# Patient Record
Sex: Male | Born: 1955 | Race: White | Hispanic: No | Marital: Married | Smoking: Never smoker
Health system: Southern US, Community
[De-identification: ages and names within clinical notes are randomized; demographics above are authoritative.]

## PROBLEM LIST (undated history)

## (undated) DIAGNOSIS — D352 Benign neoplasm of pituitary gland: Secondary | ICD-10-CM

## (undated) DIAGNOSIS — K219 Gastro-esophageal reflux disease without esophagitis: Secondary | ICD-10-CM

## (undated) DIAGNOSIS — M5412 Radiculopathy, cervical region: Secondary | ICD-10-CM

## (undated) DIAGNOSIS — M5030 Other cervical disc degeneration, unspecified cervical region: Secondary | ICD-10-CM

## (undated) DIAGNOSIS — R042 Hemoptysis: Secondary | ICD-10-CM

## (undated) DIAGNOSIS — IMO0001 Reserved for inherently not codable concepts without codable children: Secondary | ICD-10-CM

## (undated) DIAGNOSIS — R931 Abnormal findings on diagnostic imaging of heart and coronary circulation: Secondary | ICD-10-CM

## (undated) DIAGNOSIS — I10 Essential (primary) hypertension: Secondary | ICD-10-CM

## (undated) HISTORY — DX: Other cervical disc degeneration, unspecified cervical region: M50.30

## (undated) HISTORY — DX: Essential (primary) hypertension: I10

## (undated) HISTORY — DX: Gastro-esophageal reflux disease without esophagitis: K21.9

## (undated) HISTORY — DX: Abnormal findings on diagnostic imaging of heart and coronary circulation: R93.1

## (undated) HISTORY — DX: Hemoptysis: R04.2

## (undated) HISTORY — DX: Reserved for inherently not codable concepts without codable children: IMO0001

## (undated) HISTORY — DX: Radiculopathy, cervical region: M54.12

## (undated) HISTORY — PX: RIGHT HEART CATH: SHX6075

## (undated) HISTORY — PX: CATARACT EXTRACTION: SUR2

## (undated) HISTORY — DX: Benign neoplasm of pituitary gland: D35.2

---

## 2003-06-13 ENCOUNTER — Ambulatory Visit (HOSPITAL_COMMUNITY): Admission: RE | Admit: 2003-06-13 | Discharge: 2003-06-13 | Payer: Self-pay | Admitting: *Deleted

## 2003-06-13 IMAGING — CR DG CHEST 2V
2 series · 2 of 2 positions shown · non-contrast
Comparison: None.

CLINICAL DATA: Chest pain; pre-cardiac catheterization evaluation. 
 TWO VIEW CHEST ? [DATE]

[view not recorded (1 of 2)]
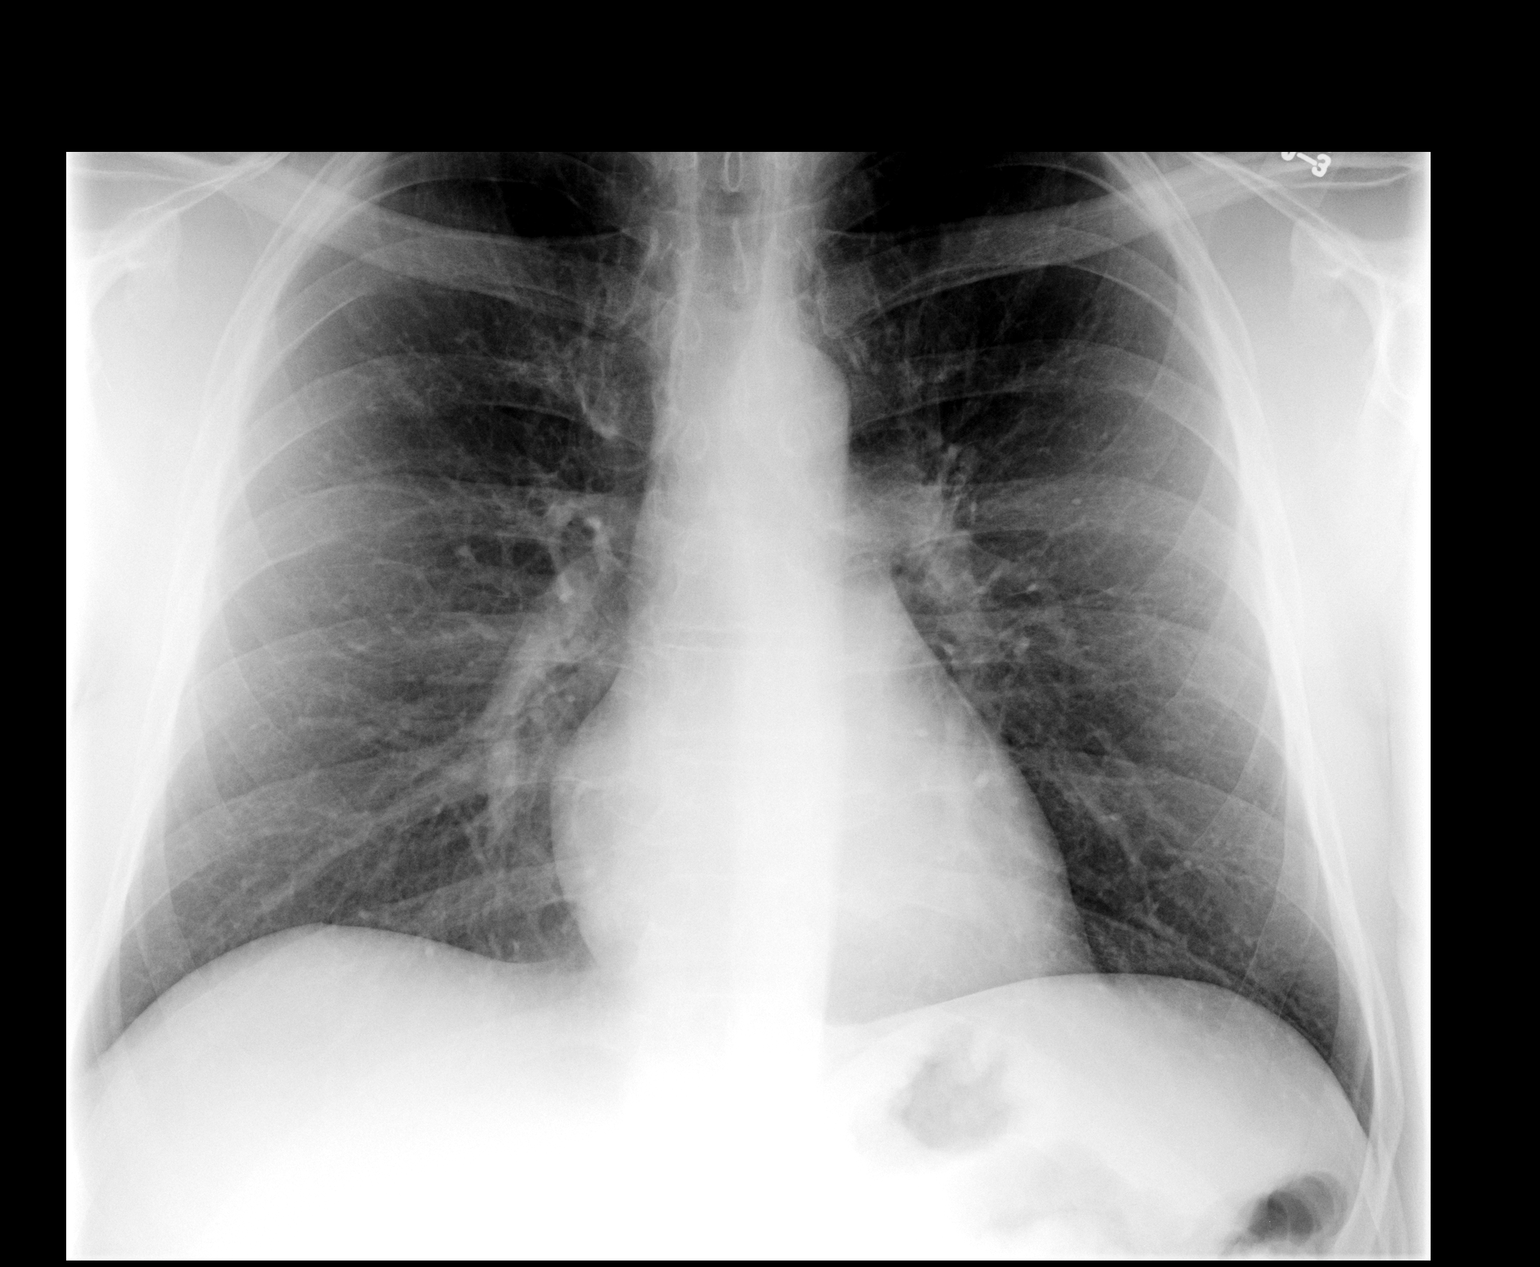

[view not recorded (2 of 2)]
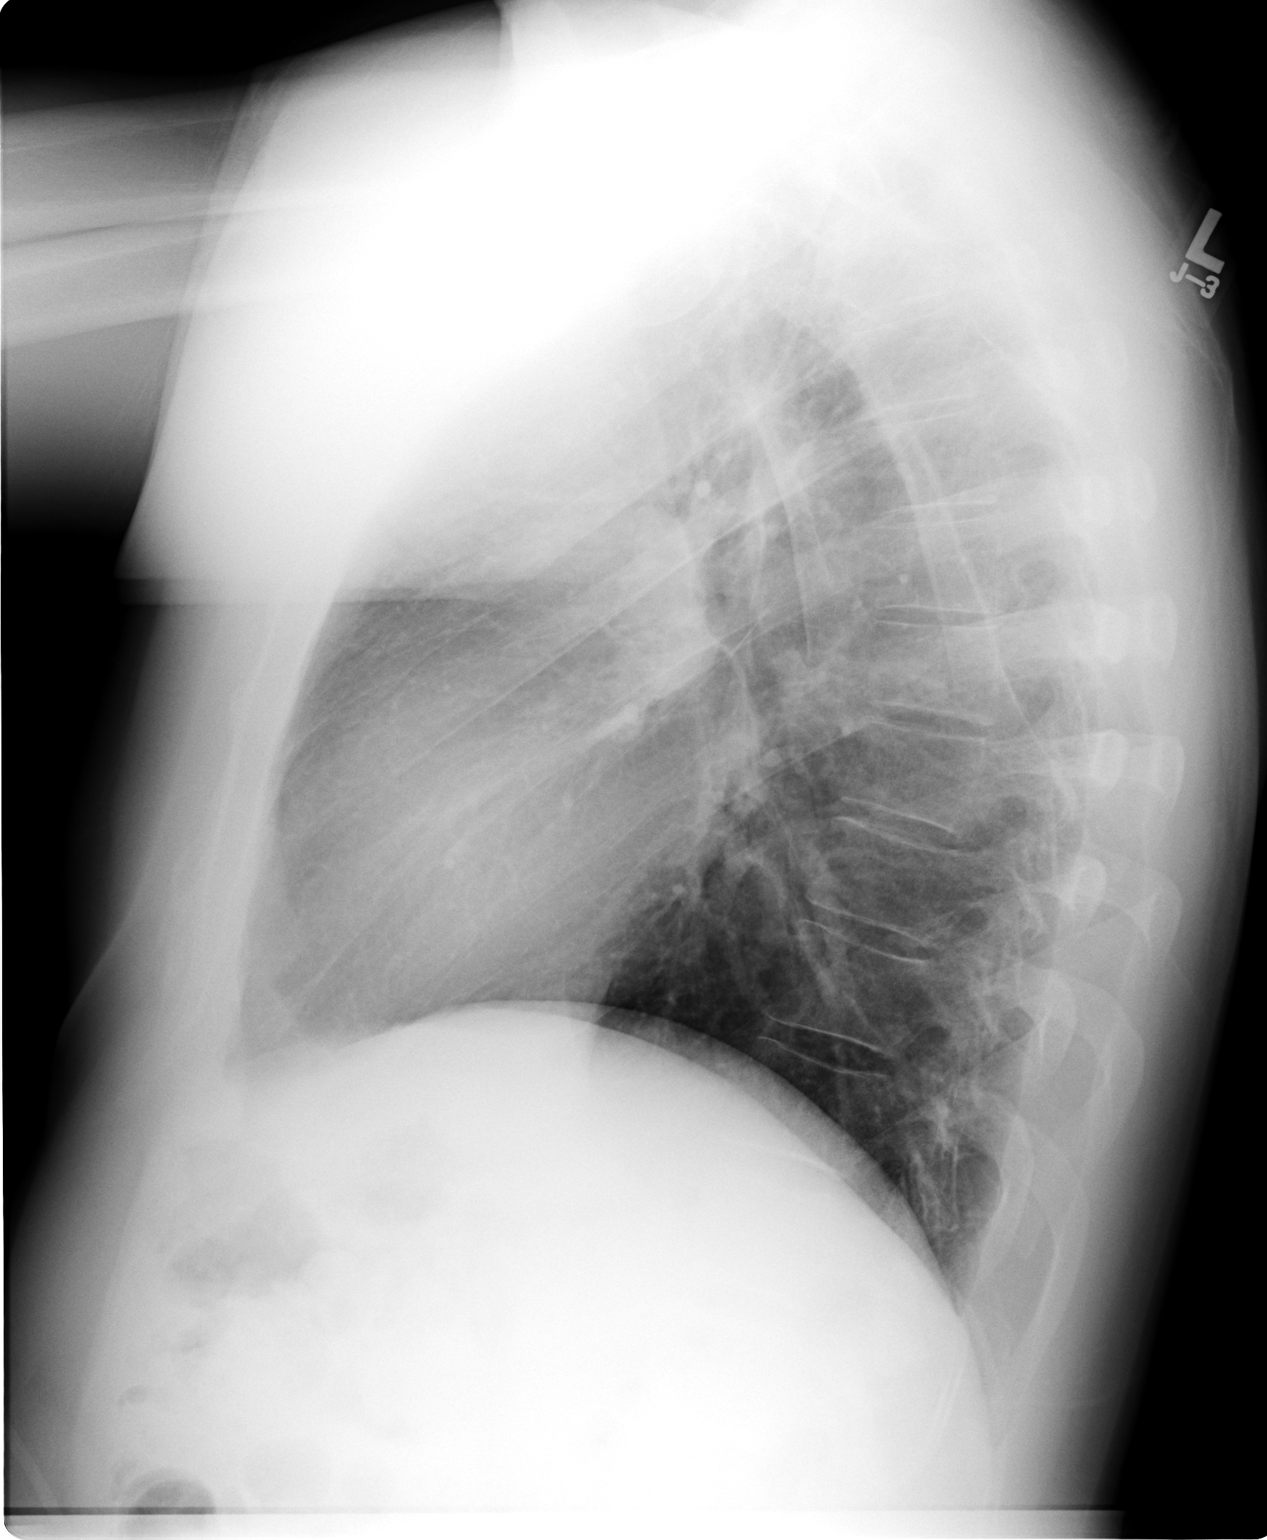

[2 of 2 positions shown; findings below may reference images not displayed]

The cardiomediastinal silhouette is unremarkable.  The lungs appear clear. The visualized bony thorax appears intact.  
 IMPRESSION 
 Normal chest.

## 2004-01-07 HISTORY — PX: RIGHT HEART CATH: SHX6075

## 2011-03-27 ENCOUNTER — Encounter: Payer: Self-pay | Admitting: Cardiovascular Disease

## 2011-04-18 ENCOUNTER — Other Ambulatory Visit (HOSPITAL_COMMUNITY): Payer: Self-pay | Admitting: Family Medicine

## 2011-04-18 ENCOUNTER — Ambulatory Visit (HOSPITAL_COMMUNITY): Payer: PRIVATE HEALTH INSURANCE | Attending: Internal Medicine

## 2011-04-18 ENCOUNTER — Other Ambulatory Visit: Payer: Self-pay

## 2011-04-18 DIAGNOSIS — E785 Hyperlipidemia, unspecified: Secondary | ICD-10-CM | POA: Insufficient documentation

## 2011-04-18 DIAGNOSIS — R079 Chest pain, unspecified: Secondary | ICD-10-CM

## 2011-04-18 DIAGNOSIS — I10 Essential (primary) hypertension: Secondary | ICD-10-CM | POA: Insufficient documentation

## 2011-04-18 DIAGNOSIS — R9431 Abnormal electrocardiogram [ECG] [EKG]: Secondary | ICD-10-CM | POA: Insufficient documentation

## 2011-04-18 DIAGNOSIS — I080 Rheumatic disorders of both mitral and aortic valves: Secondary | ICD-10-CM | POA: Insufficient documentation

## 2011-04-21 ENCOUNTER — Other Ambulatory Visit (HOSPITAL_COMMUNITY): Payer: Self-pay | Admitting: Radiology

## 2011-04-21 DIAGNOSIS — R9431 Abnormal electrocardiogram [ECG] [EKG]: Secondary | ICD-10-CM

## 2011-04-22 ENCOUNTER — Ambulatory Visit (HOSPITAL_COMMUNITY): Payer: PRIVATE HEALTH INSURANCE | Attending: Cardiology

## 2011-04-22 ENCOUNTER — Encounter (HOSPITAL_COMMUNITY): Payer: Self-pay | Admitting: *Deleted

## 2011-04-22 ENCOUNTER — Encounter: Payer: Self-pay | Admitting: Cardiovascular Disease

## 2011-04-22 DIAGNOSIS — R5383 Other fatigue: Secondary | ICD-10-CM | POA: Insufficient documentation

## 2011-04-22 DIAGNOSIS — R9431 Abnormal electrocardiogram [ECG] [EKG]: Secondary | ICD-10-CM

## 2011-04-22 DIAGNOSIS — R072 Precordial pain: Secondary | ICD-10-CM | POA: Insufficient documentation

## 2011-04-22 DIAGNOSIS — R55 Syncope and collapse: Secondary | ICD-10-CM | POA: Insufficient documentation

## 2011-04-22 DIAGNOSIS — E785 Hyperlipidemia, unspecified: Secondary | ICD-10-CM | POA: Insufficient documentation

## 2011-04-22 DIAGNOSIS — R000 Tachycardia, unspecified: Secondary | ICD-10-CM | POA: Insufficient documentation

## 2011-04-22 DIAGNOSIS — R079 Chest pain, unspecified: Secondary | ICD-10-CM

## 2011-04-22 DIAGNOSIS — R42 Dizziness and giddiness: Secondary | ICD-10-CM | POA: Insufficient documentation

## 2011-04-22 DIAGNOSIS — R0989 Other specified symptoms and signs involving the circulatory and respiratory systems: Secondary | ICD-10-CM

## 2011-04-22 DIAGNOSIS — R002 Palpitations: Secondary | ICD-10-CM | POA: Insufficient documentation

## 2011-04-22 DIAGNOSIS — I10 Essential (primary) hypertension: Secondary | ICD-10-CM | POA: Insufficient documentation

## 2011-04-22 DIAGNOSIS — R5381 Other malaise: Secondary | ICD-10-CM | POA: Insufficient documentation

## 2011-04-22 DIAGNOSIS — Z8249 Family history of ischemic heart disease and other diseases of the circulatory system: Secondary | ICD-10-CM | POA: Insufficient documentation

## 2011-04-23 ENCOUNTER — Encounter (HOSPITAL_COMMUNITY): Payer: Self-pay | Admitting: Family Medicine

## 2015-05-31 ENCOUNTER — Encounter: Payer: Self-pay | Admitting: Allergy and Immunology

## 2015-05-31 ENCOUNTER — Ambulatory Visit (INDEPENDENT_AMBULATORY_CARE_PROVIDER_SITE_OTHER): Payer: PRIVATE HEALTH INSURANCE | Admitting: Allergy and Immunology

## 2015-05-31 VITALS — BP 126/78 | HR 76 | Temp 98.1°F | Resp 20 | Ht 72.0 in | Wt 262.3 lb

## 2015-05-31 DIAGNOSIS — R059 Cough, unspecified: Secondary | ICD-10-CM

## 2015-05-31 DIAGNOSIS — J387 Other diseases of larynx: Secondary | ICD-10-CM | POA: Diagnosis not present

## 2015-05-31 DIAGNOSIS — K219 Gastro-esophageal reflux disease without esophagitis: Secondary | ICD-10-CM

## 2015-05-31 DIAGNOSIS — R05 Cough: Secondary | ICD-10-CM

## 2015-05-31 MED ORDER — RANITIDINE HCL 300 MG PO CAPS: 300.0000 mg | ORAL_CAPSULE | Freq: Every evening | ORAL | Status: DC

## 2015-05-31 NOTE — Progress Notes (Signed)
Dear Dr. Nicki Reaper,  Thank you for referring Adam [name] to the [facility] on 05/31/2015.   Below is a summation of this patient's evaluation and recommendations.  Thank you for your referral. I will keep you informed about this patient's response to treatment.   If you have any questions please to do hestitate to contact me.   Sincerely,  Jiles Prows, MD [place]   ______________________________________________________________________    NEW PATIENT NOTE  Referring Provider: Myer Peer, MD Primary Provider: Ann Held, MD Date of office visit: 05/31/2015    Subjective:   Chief Complaint:  Adam [name] (DOB: May 22, 1955) is a 60 y.o. male with a chief complaint of Cough  who presents to the clinic on 05/31/2015 with the following problems:  HPI: Adam [name] presents to this clinic in evaluation of cough. He's been coughing for years but it is been somewhat worse the past year. He has spells of cough associated with gagging and retching. He states that he is developed "whooping cough" several times. He has this tickle in his throat and he gets intermittent raspy voice. He does not have any associated chest tightness or shortness of breath or exercise induced bronchospastic symptoms or cold air induced bronchospastic symptoms. He's been given a Symbicort inhaler which he has tried this inhaler preventatively before going to church which apparently does trigger some of this coughing but this was ineffective in preventing this cough. Sometimes strong smells precipitate his cough as well. He does not really have a tremendous amount of atopic disease. It does not sound as though he's had a chest x-ray in the past decade.  Adam [name] does have reflux. He's had very significant reflux in the past associated with chest pain for which he has had a negative cardiac workup. He's been treated with  zegerid and omeprazole in the past and 3 weeks ago was given dexilant which has helped his heartburn and regurgitation and chest discomfort quite a lot since starting this agent. He drinks a coffee and 3 sodas and tea throughout the day and intermittently eats chocolate.  Past Medical History  Diagnosis Date  . Hypertension   . Reflux     Past Surgical History  Procedure Laterality Date  . Right heart cath    . Cataract extraction        Medication List           aspirin EC 81 MG tablet  Take 81 mg by mouth daily.     budesonide-formoterol 160-4.5 MCG/ACT inhaler  Commonly known as:  SYMBICORT  Inhale 2 puffs into the lungs 2 (two) times daily.     cetirizine 10 MG tablet  Commonly known as:  ZYRTEC  Take 10 mg by mouth daily.     chlorthalidone 25 MG tablet  Commonly known as:  HYGROTON  Take 25 mg by mouth daily.     DEXILANT 60 MG capsule  Generic drug:  dexlansoprazole  Take 60 mg by mouth daily.     FLAXSEED OIL MAX STR 1300 MG Caps  Take 1 tablet by mouth daily.     levothyroxine 75 MCG tablet  Commonly known as:  SYNTHROID, LEVOTHROID  Take 75 mcg by mouth daily before breakfast.     metoprolol succinate 25 MG 24 hr tablet  Commonly known as:  TOPROL-XL  Take 25 mg by mouth daily.     rosuvastatin 10  MG tablet  Commonly known as:  CRESTOR  Take 10 mg by mouth daily.     telmisartan 80 MG tablet  Commonly known as:  MICARDIS  Take 80 mg by mouth daily.     testosterone cypionate 200 MG/ML injection  Commonly known as:  DEPOTESTOSTERONE CYPIONATE  Inject 1 mL into the muscle every 14 (fourteen) days.        No Known Allergies  Review of systems negative except as noted in HPI / PMHx or noted below:  Review of Systems  Constitutional: Negative.   HENT: Negative.   Eyes: Negative.   Respiratory: Negative.   Cardiovascular: Negative.   Gastrointestinal: Negative.   Genitourinary: Negative.   Musculoskeletal: Negative.   Skin: Negative.     Neurological: Negative.   Endo/Heme/Allergies: Negative.   Psychiatric/Behavioral: Negative.     Family History  Problem Relation Age of Onset  . Hypertension Mother   . Ovarian cancer Mother   . Hypertension Father   . Pancreatic cancer Father   . Hypertension Sister   . Hypertension Brother   . Ovarian cancer Maternal Grandmother   . Heart disease Maternal Grandfather     Social History   Social History  . Marital Status: Married    Spouse Name: N/A  . Number of Children: N/A  . Years of Education: N/A   Occupational History  . Not on file.   Social History Main Topics  . Smoking status: Never Smoker   . Smokeless tobacco: Never Used  . Alcohol Use: Not on file  . Drug Use: Not on file  . Sexual Activity: Not on file   Other Topics Concern  . Not on file   Social History Narrative  . No narrative on file    Environmental and Social history  Lives in a house with a dry environment, no animals located inside the household, carpeting in the bedroom, no plastic on the better pillow, and no smokers located inside the household. He works as a Counsellor for [employer]   Objective:   Filed Vitals:   05/31/15 0901  BP: 126/78  Pulse: 76  Temp: 98.1 F (36.7 C)  Resp: 20   Height: 6' (182.9 cm) Weight: 262 lb 5.6 oz (119 kg)  Physical Exam  Constitutional: He is well-developed, well-nourished, and in no distress.  Slightly raspy voice  HENT:  Head: Normocephalic. Head is without right periorbital erythema and without left periorbital erythema.  Right Ear: Tympanic membrane, external ear and ear canal normal.  Left Ear: Tympanic membrane, external ear and ear canal normal.  Nose: Nose normal. No mucosal edema or rhinorrhea.  Mouth/Throat: Uvula is midline, oropharynx is clear and moist and mucous membranes are normal. No oropharyngeal exudate.  Eyes: Conjunctivae and lids are normal. Pupils are equal, round, and reactive to light.  Neck: Trachea normal. No  tracheal tenderness present. No tracheal deviation present. No thyromegaly present.  Cardiovascular: Normal rate, regular rhythm, S1 normal, S2 normal and normal heart sounds.   No murmur heard. Pulmonary/Chest: Effort normal and breath sounds normal. No stridor. No tachypnea. No respiratory distress. He has no wheezes. He has no rales. He exhibits no tenderness.  Abdominal: Soft. He exhibits no distension and no mass. There is no hepatosplenomegaly. There is no tenderness. There is no rebound and no guarding.  Musculoskeletal: He exhibits no edema or tenderness.  Lymphadenopathy:       Head (right side): No tonsillar adenopathy present.       Head (left  side): No tonsillar adenopathy present.    He has no cervical adenopathy.    He has no axillary adenopathy.  Neurological: He is alert. Gait normal.  Skin: No rash noted. He is not diaphoretic. No erythema. No pallor. Nails show no clubbing.  Psychiatric: Mood and affect normal.     Diagnostics: Allergy skin tests were not performed.   Spirometry was performed and demonstrated an FEV1 of 4.01 @ 103 % of predicted. Following the administration of nebulized albuterol his FEV1 rose to 4.17 which was a change of 4%.    Assessment and Plan:    1. LPRD (laryngopharyngeal reflux disease)   2. Cough     1. Consolidate caffeine and chocolate consumption  2. Continue Dexilant 60 mg one tablet in a.m.  3. Start ranitidine 300 mg one tablet in PM  4. Can continue Zyrtec 10 mg once a day if needed  5. Obtain a chest x-ray for cough  6. Return to clinic in 4 weeks or earlier if problem  Prior to moving forward with an investigation of multiple etiologic factors that may be contributing to Adam [name]'s cough I will empirically treat him with aggressive therapy directed against reflux with a combination of proton pump inhibitor and an H2 receptor blocker and have him consolidate his caffeine and chocolate consumption. His history is quite  consistent with LPR and hopefully within the next 4-12 weeks he will resolve the majority of his cough. I will obtain a chest x-ray as a screening test for other etiologic factors that may be contributing to his cough. I will hold off on any further evaluation for inflammatory respiratory disease at this point. I will see him back in this clinic in 4 weeks.   Jiles Prows, MD [facility]

## 2015-05-31 NOTE — Patient Instructions (Addendum)
  1. Consolidate caffeine and chocolate consumption  2. Continue Dexilant 60 mg one tablet in a.m.  3. Start ranitidine 300 mg one tablet in PM  4. Can continue Zyrtec 10 mg once a day if needed  5. Obtain a chest x-ray for cough  6. Return to clinic in 4 weeks or earlier if problem

## 2015-06-29 ENCOUNTER — Ambulatory Visit (INDEPENDENT_AMBULATORY_CARE_PROVIDER_SITE_OTHER): Payer: PRIVATE HEALTH INSURANCE | Admitting: Allergy and Immunology

## 2015-06-29 ENCOUNTER — Encounter: Payer: Self-pay | Admitting: Allergy and Immunology

## 2015-06-29 VITALS — BP 130/72 | HR 74 | Resp 16

## 2015-06-29 DIAGNOSIS — R059 Cough, unspecified: Secondary | ICD-10-CM

## 2015-06-29 DIAGNOSIS — J387 Other diseases of larynx: Secondary | ICD-10-CM

## 2015-06-29 DIAGNOSIS — R05 Cough: Secondary | ICD-10-CM | POA: Diagnosis not present

## 2015-06-29 DIAGNOSIS — K219 Gastro-esophageal reflux disease without esophagitis: Secondary | ICD-10-CM

## 2015-06-29 NOTE — Patient Instructions (Signed)
  1. Continue to Consolidate caffeine and chocolate consumption  2. Continue Dexilant 60 mg one tablet in a.m.  3. Continue ranitidine 300 mg one tablet in PM  4. Can continue Zyrtec 10 mg once a day if needed  5. Return to clinic in 8 weeks or earlier if problem

## 2015-06-29 NOTE — Progress Notes (Signed)
Follow-up Note  Referring Provider: Myer Peer, MD Primary Provider: Ann Held, MD Date of Office Visit: 06/29/2015  Subjective:   Adam [name] (DOB: October 12, 1955) is a 60 y.o. male who returns to the [facility] on 06/29/2015 in re-evaluation of the following:  HPI: Strummer returns to this clinic in evaluation of his cough. He has done wonderful since initiating medical therapy on 06/01/2015. He's only had one coughing spell that lasted less than a day after being exposed to very strong scents while at a funeral. Otherwise he is done very well and the tickle in his throat is basically resolved and he doesn't have any raspy voice. He's been very good about eliminating all this caffeine and chocolate consumption and he continues on his proton pump inhibitor and ranitidine    Medication List           aspirin EC 81 MG tablet  Take 81 mg by mouth daily.     budesonide-formoterol 160-4.5 MCG/ACT inhaler  Commonly known as:  SYMBICORT  Inhale 2 puffs into the lungs 2 (two) times daily.     cetirizine 10 MG tablet  Commonly known as:  ZYRTEC  Take 10 mg by mouth daily.     chlorthalidone 25 MG tablet  Commonly known as:  HYGROTON  Take 25 mg by mouth daily.     DEXILANT 60 MG capsule  Generic drug:  dexlansoprazole  Take 60 mg by mouth daily.     FLAXSEED OIL MAX STR 1300 MG Caps  Take 1 tablet by mouth daily.     levothyroxine 75 MCG tablet  Commonly known as:  SYNTHROID, LEVOTHROID  Take 75 mcg by mouth daily before breakfast.     metoprolol succinate 25 MG 24 hr tablet  Commonly known as:  TOPROL-XL  Take 25 mg by mouth daily.     ranitidine 300 MG capsule  Commonly known as:  ZANTAC  Take 1 capsule (300 mg total) by mouth every evening.     rosuvastatin 10 MG tablet  Commonly known as:  CRESTOR  Take 10 mg by mouth daily.     telmisartan 80 MG tablet  Commonly known as:  MICARDIS  Take 80 mg by mouth daily.     testosterone cypionate  200 MG/ML injection  Commonly known as:  DEPOTESTOSTERONE CYPIONATE  Inject 1 mL into the muscle every 14 (fourteen) days.        Past Medical History  Diagnosis Date  . Hypertension   . Reflux     Past Surgical History  Procedure Laterality Date  . Right heart cath    . Cataract extraction      No Known Allergies  Review of systems negative except as noted in HPI / PMHx or noted below:  Review of Systems  Constitutional: Negative.   HENT: Negative.   Eyes: Negative.   Respiratory: Negative.   Cardiovascular: Negative.   Gastrointestinal: Negative.   Genitourinary: Negative.   Musculoskeletal: Negative.   Skin: Negative.   Neurological: Negative.   Endo/Heme/Allergies: Negative.   Psychiatric/Behavioral: Negative.      Objective:   Filed Vitals:   06/29/15 1106  BP: 130/72  Pulse: 74  Resp: 16          Physical Exam  Constitutional: He is well-developed, well-nourished, and in no distress.  HENT:  Head: Normocephalic.  Right Ear: Tympanic membrane, external ear and ear canal normal.  Left Ear: Tympanic membrane, external ear and ear  canal normal.  Nose: Nose normal. No mucosal edema or rhinorrhea.  Mouth/Throat: Uvula is midline, oropharynx is clear and moist and mucous membranes are normal. No oropharyngeal exudate.  Eyes: Conjunctivae are normal.  Neck: Trachea normal. No tracheal tenderness present. No tracheal deviation present. No thyromegaly present.  Cardiovascular: Normal rate, regular rhythm, S1 normal, S2 normal and normal heart sounds.   No murmur heard. Pulmonary/Chest: Breath sounds normal. No stridor. No respiratory distress. He has no wheezes. He has no rales.  Musculoskeletal: He exhibits no edema.  Lymphadenopathy:       Head (right side): No tonsillar adenopathy present.       Head (left side): No tonsillar adenopathy present.    He has no cervical adenopathy.  Neurological: He is alert. Gait normal.  Skin: No rash noted. He is  not diaphoretic. No erythema. Nails show no clubbing.  Psychiatric: Mood and affect normal.    Diagnostics: Results of a chest x-ray obtained on 05/31/2015 identified no significant abnormality.   Assessment and Plan:   1. LPRD (laryngopharyngeal reflux disease)   2. Cough     1. Continue to Consolidate caffeine and chocolate consumption  2. Continue Dexilant 60 mg one tablet in a.m.  3. Continue ranitidine 300 mg one tablet in PM  4. Can continue Zyrtec 10 mg once a day if needed  5. Return to clinic in 8 weeks or earlier if problem  Jahmier has done quite well on his initial month of medical therapy and I would like for him to continue on this plan for at least a full 12 weeks and thus I will see him back in this clinic in 8 weeks. If he continues to do well there will probably be a opportunity to consolidate his medical therapy.  Allena Katz, MD [place]

## 2015-07-20 DIAGNOSIS — E291 Testicular hypofunction: Secondary | ICD-10-CM

## 2015-07-20 DIAGNOSIS — E039 Hypothyroidism, unspecified: Secondary | ICD-10-CM

## 2015-07-20 DIAGNOSIS — R7309 Other abnormal glucose: Secondary | ICD-10-CM | POA: Insufficient documentation

## 2015-07-20 DIAGNOSIS — E221 Hyperprolactinemia: Secondary | ICD-10-CM

## 2015-07-20 HISTORY — DX: Testicular hypofunction: E29.1

## 2015-07-20 HISTORY — DX: Hyperprolactinemia: E22.1

## 2015-07-20 HISTORY — DX: Hypothyroidism, unspecified: E03.9

## 2015-07-20 HISTORY — DX: Other abnormal glucose: R73.09

## 2015-08-24 ENCOUNTER — Encounter: Payer: Self-pay | Admitting: Allergy and Immunology

## 2015-08-24 ENCOUNTER — Ambulatory Visit (INDEPENDENT_AMBULATORY_CARE_PROVIDER_SITE_OTHER): Payer: PRIVATE HEALTH INSURANCE | Admitting: Allergy and Immunology

## 2015-08-24 VITALS — BP 130/80 | HR 60 | Resp 16

## 2015-08-24 DIAGNOSIS — J387 Other diseases of larynx: Secondary | ICD-10-CM | POA: Diagnosis not present

## 2015-08-24 DIAGNOSIS — Z8709 Personal history of other diseases of the respiratory system: Secondary | ICD-10-CM

## 2015-08-24 DIAGNOSIS — Z87898 Personal history of other specified conditions: Secondary | ICD-10-CM

## 2015-08-24 DIAGNOSIS — K219 Gastro-esophageal reflux disease without esophagitis: Secondary | ICD-10-CM

## 2015-08-24 NOTE — Patient Instructions (Addendum)
  1. Continue to Avoid caffeine and chocolate consumption  2. Continue Dexilant 60 mg one tablet in a.m.  3. Try to discontinue ranitidine 300 mg one tablet in PM  4. Can continue Zyrtec 10 mg once a day if needed  5. Return to clinic in January 2018 or earlier if problem  6. Obtain fall flu vaccine

## 2015-08-24 NOTE — Progress Notes (Signed)
Follow-up Note  Referring Provider: Myer Peer, MD Primary Provider: Ann Held, MD Date of Office Visit: 08/24/2015  Subjective:   Adam [name] (DOB: April 07, 1955) is a 60 y.o. male who returns to the [facility] on 08/24/2015 in re-evaluation of the following:  HPI: Adam [name] returns to this clinic in evaluation of his LPR. I last saw him in his clinic in June 2017. He has done excellent with his medical therapy which includes a proton pump inhibitor and H2 receptor blocker and complete elimination of all caffeine and a loss of body weight of approximately 15 pounds. He has no issues with cough and no issues with his throat and no issues with his voice.    Medication List      aspirin EC 81 MG tablet Take 81 mg by mouth daily.   cetirizine 10 MG tablet Commonly known as:  ZYRTEC Take 10 mg by mouth daily.   chlorthalidone 25 MG tablet Commonly known as:  HYGROTON Take 25 mg by mouth daily.   DEXILANT 60 MG capsule Generic drug:  dexlansoprazole Take 60 mg by mouth daily.   FLAXSEED OIL MAX STR 1300 MG Caps Take 1 tablet by mouth daily.   levothyroxine 75 MCG tablet Commonly known as:  SYNTHROID, LEVOTHROID Take 75 mcg by mouth daily before breakfast.   metoprolol succinate 25 MG 24 hr tablet Commonly known as:  TOPROL-XL Take 25 mg by mouth daily.   ranitidine 300 MG capsule Commonly known as:  ZANTAC Take 1 capsule (300 mg total) by mouth every evening.   rosuvastatin 10 MG tablet Commonly known as:  CRESTOR Take 10 mg by mouth daily.   telmisartan 80 MG tablet Commonly known as:  MICARDIS Take 80 mg by mouth daily.   testosterone cypionate 200 MG/ML injection Commonly known as:  DEPOTESTOSTERONE CYPIONATE Inject 1 mL into the muscle every 14 (fourteen) days.       Past Medical History:  Diagnosis Date  . Hypertension   . Reflux     Past Surgical History:  Procedure Laterality Date  . CATARACT EXTRACTION    . RIGHT HEART  CATH      No Known Allergies  Review of systems negative except as noted in HPI / PMHx or noted below:  Review of Systems  Constitutional: Negative.   HENT: Negative.   Eyes: Negative.   Respiratory: Negative.   Cardiovascular: Negative.   Gastrointestinal: Negative.   Genitourinary: Negative.   Musculoskeletal: Negative.   Skin: Negative.   Neurological: Negative.   Endo/Heme/Allergies: Negative.   Psychiatric/Behavioral: Negative.      Objective:   Vitals:   08/24/15 1030  BP: 130/80  Pulse: 60  Resp: 16          Physical Exam  Constitutional: He is well-developed, well-nourished, and in no distress.  HENT:  Head: Normocephalic.  Right Ear: Tympanic membrane, external ear and ear canal normal.  Left Ear: Tympanic membrane, external ear and ear canal normal.  Nose: Nose normal. No mucosal edema or rhinorrhea.  Mouth/Throat: Uvula is midline, oropharynx is clear and moist and mucous membranes are normal. No oropharyngeal exudate.  Eyes: Conjunctivae are normal.  Neck: Trachea normal. No tracheal tenderness present. No tracheal deviation present. No thyromegaly present.  Cardiovascular: Normal rate, regular rhythm, S1 normal, S2 normal and normal heart sounds.   No murmur heard. Pulmonary/Chest: Breath sounds normal. No stridor. No respiratory distress. He has no wheezes. He has no rales.  Musculoskeletal:  He exhibits no edema.  Lymphadenopathy:       Head (right side): No tonsillar adenopathy present.       Head (left side): No tonsillar adenopathy present.    He has no cervical adenopathy.  Neurological: He is alert. Gait normal.  Skin: No rash noted. He is not diaphoretic. No erythema. Nails show no clubbing.  Psychiatric: Mood and affect normal.    Diagnostics: None   Assessment and Plan:   1. LPRD (laryngopharyngeal reflux disease)   2. History of chronic cough     1. Continue to Avoid caffeine and chocolate consumption  2. Continue Dexilant 60  mg one tablet in a.m.  3. Try to discontinue ranitidine 300 mg one tablet in PM  4. Can continue Zyrtec 10 mg once a day if needed  5. Return to clinic in January 2018 or earlier if problem  6. Obtain fall flu vaccine  Adam [name] has done excellent on his therapy and I think we can now see if he can tolerate consolidation of this therapy by eliminating his H2 receptor blocker while he continues on a proton pump inhibitor. I suspect that with his behavioral manipulation which includes eliminating all caffeine consumption and losing weight he will do very well. There may even be an opportunity to further consolidate his treatment when we see him back in this clinic in January. He will contact me during the interval should there be a problem.  Allena Katz, MD [place]

## 2016-01-25 ENCOUNTER — Ambulatory Visit: Payer: PRIVATE HEALTH INSURANCE | Admitting: Allergy and Immunology

## 2016-02-01 ENCOUNTER — Ambulatory Visit: Payer: PRIVATE HEALTH INSURANCE | Admitting: Allergy and Immunology

## 2017-07-17 ENCOUNTER — Encounter: Payer: Self-pay | Admitting: *Deleted

## 2017-07-23 ENCOUNTER — Other Ambulatory Visit (INDEPENDENT_AMBULATORY_CARE_PROVIDER_SITE_OTHER): Payer: Commercial Managed Care - PPO

## 2017-07-23 ENCOUNTER — Ambulatory Visit (INDEPENDENT_AMBULATORY_CARE_PROVIDER_SITE_OTHER): Payer: Commercial Managed Care - PPO | Admitting: Internal Medicine

## 2017-07-23 ENCOUNTER — Encounter: Payer: Self-pay | Admitting: Internal Medicine

## 2017-07-23 VITALS — BP 130/80 | HR 61 | Ht 73.0 in | Wt 265.8 lb

## 2017-07-23 DIAGNOSIS — R053 Chronic cough: Secondary | ICD-10-CM

## 2017-07-23 DIAGNOSIS — R05 Cough: Secondary | ICD-10-CM

## 2017-07-23 DIAGNOSIS — J387 Other diseases of larynx: Secondary | ICD-10-CM | POA: Diagnosis not present

## 2017-07-23 DIAGNOSIS — R059 Cough, unspecified: Secondary | ICD-10-CM

## 2017-07-23 LAB — CBC WITH DIFFERENTIAL/PLATELET
BASOS ABS: 0 10*3/uL (ref 0.0–0.1)
Basophils Relative: 0.4 % (ref 0.0–3.0)
EOS PCT: 1.6 % (ref 0.0–5.0)
Eosinophils Absolute: 0.1 10*3/uL (ref 0.0–0.7)
HEMATOCRIT: 42 % (ref 39.0–52.0)
HEMOGLOBIN: 14 g/dL (ref 13.0–17.0)
LYMPHS PCT: 25.3 % (ref 12.0–46.0)
Lymphs Abs: 1.4 10*3/uL (ref 0.7–4.0)
MCHC: 33.3 g/dL (ref 30.0–36.0)
MCV: 93.3 fl (ref 78.0–100.0)
MONOS PCT: 7.8 % (ref 3.0–12.0)
Monocytes Absolute: 0.4 10*3/uL (ref 0.1–1.0)
NEUTROS PCT: 64.9 % (ref 43.0–77.0)
Neutro Abs: 3.7 10*3/uL (ref 1.4–7.7)
Platelets: 182 10*3/uL (ref 150.0–400.0)
RBC: 4.5 Mil/uL (ref 4.22–5.81)
RDW: 13.7 % (ref 11.5–15.5)
WBC: 5.7 10*3/uL (ref 4.0–10.5)

## 2017-07-23 LAB — NITRIC OXIDE: Nitric Oxide: 21

## 2017-07-23 MED ORDER — GABAPENTIN 300 MG PO CAPS: ORAL_CAPSULE | ORAL | 1 refills | Status: DC

## 2017-07-23 MED ORDER — ALBUTEROL SULFATE HFA 108 (90 BASE) MCG/ACT IN AERS: 2.0000 | INHALATION_SPRAY | Freq: Four times a day (QID) | RESPIRATORY_TRACT | 6 refills | Status: DC | PRN

## 2017-07-23 NOTE — Progress Notes (Signed)
Subjective:    Patient ID: Adam [name], male    DOB: [birth year], 62 y.o.   MRN: [id number]  PCP Adam Peer, MD   HPI  IOV 07/23/2017  Chief Complaint  Patient presents with  . Consult    Self referral due to cough and wheezing that pt states has been happening x1 year. Denies any complaints of SOB or CP.    Adam [name] , 62 y.o. , with dob 1955-01-22 and male ,Not Hispanic or Latino from [place] [address] - presents to lung clinic for cough chronic eval. He is a friend of my patient Mr. 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 Patient is here with his wife. He reports insidious onset of chronic cough for the last few to several years. He does not know how it started but suspects that he's had episodes of whooping cough in the pastand this cough has lingered since then. The cough is essentially dry in quality but occasionally associated with sputum production that is clear. He feels the cough comes from his throat. It is variable in severity from mild to severe. He feels quality and severity of the cough is deteriorated in the last few to several months. It is associated with clearing of the throat and very occasional wheezing but no shortness of breath or edema or orthopnea. The cough does not happen at night. It happens only in the daytime. Aggravated by talking or laughing and sometimes by indoors. Relieved by rest and drinking water and staying quite. There is no associated radiation. Severity index is documented below and the RSI cough score.  There is no associated ACE inhibitor intake. He does have acid reflux for which he takes PPIwith mostly good control. He does drink a lot of coffee. He associates himself with some postnasal drip.He's had allergy testing many years ago. He has dogs at home. Apparently allergy test was negative. Exhaled nitric oxide today was 21 ppb and argues against asthma. He does take Symbicort as needed     Adam [name] Reflux Symptom Index (>  13-15 suggestive of LPR cough) 0 -> 5  =  none ->severe problem  Hoarseness of problem with voice 1  Clearing  Of Throat 2  Excess throat mucus or feeling of post nasal drip 2  Difficulty swallowing food, liquid or tablets 0  Cough after eating or lying down 0  Breathing difficulties or choking episodes 0  Troublesome or annoying cough 4  Sensation of something sticking in throat or lump in throat 2  Heartburn, chest pain, indigestion, or stomach acid coming up 2  TOTAL 13     Personally visualized chest x-ray in 2005 in 2017 and both look clear  Stress echocardiogram in April 2013 shows mild aortic and mitral regurgitation with ejection fraction 60%  He had a spirometry May 2017 that I personally visualized and it looks normal with normal bronchodilator response      has a past medical history of Hypertension and Reflux.   reports that he has never smoked. He has never used smokeless tobacco.  Past Surgical History:  Procedure Laterality Date  . CATARACT EXTRACTION    . RIGHT HEART CATH      No Known Allergies   There is no immunization history on file for this patient.  Family History  Problem Relation Age of Onset  . Hypertension Mother   . Ovarian cancer Mother   . Hypertension Father   . Pancreatic cancer Father   .  Ovarian cancer Maternal Grandmother   . Heart disease Maternal Grandfather   . Hypertension Sister   . Hypertension Brother      Current Outpatient Medications:  .  aspirin EC 81 MG tablet, Take 81 mg by mouth daily., Disp: , Rfl:  .  chlorthalidone (HYGROTON) 25 MG tablet, Take 25 mg by mouth daily., Disp: , Rfl:  .  dexlansoprazole (DEXILANT) 60 MG capsule, Take 60 mg by mouth daily., Disp: , Rfl:  .  levothyroxine (SYNTHROID, LEVOTHROID) 88 MCG tablet, Take 88 mcg by mouth daily before breakfast. , Disp: , Rfl:  .  metoprolol succinate (TOPROL-XL) 25 MG 24 hr tablet, Take 25 mg by mouth daily., Disp: , Rfl:  .  rosuvastatin (CRESTOR) 10 MG  tablet, Take 10 mg by mouth daily., Disp: , Rfl:  .  spironolactone (ALDACTONE) 25 MG tablet, Take 25 mg by mouth daily., Disp: , Rfl:  .  telmisartan (MICARDIS) 80 MG tablet, Take 80 mg by mouth daily., Disp: , Rfl:  .  testosterone cypionate (DEPOTESTOSTERONE CYPIONATE) 200 MG/ML injection, Inject 1 mL into the muscle every 14 (fourteen) days., Disp: , Rfl:  .  budesonide-formoterol (SYMBICORT) 160-4.5 MCG/ACT inhaler, Inhale 2 puffs into the lungs 2 (two) times daily., Disp: , Rfl:     Review of Systems  Constitutional: Negative for fever and unexpected weight change.  HENT: Positive for nosebleeds, sneezing and sore throat. Negative for congestion, dental problem, ear pain, postnasal drip, rhinorrhea, sinus pressure and trouble swallowing.   Eyes: Negative for redness and itching.  Respiratory: Positive for cough and wheezing. Negative for chest tightness and shortness of breath.   Cardiovascular: Negative for palpitations and leg swelling.  Gastrointestinal: Positive for nausea. Negative for vomiting.  Genitourinary: Negative for dysuria.  Musculoskeletal: Negative for joint swelling.  Skin: Negative for rash.  Allergic/Immunologic: Negative.  Negative for environmental allergies, food allergies and immunocompromised state.  Neurological: Negative for headaches.  Hematological: Bruises/bleeds easily.  Psychiatric/Behavioral: Negative for dysphoric mood. The patient is not nervous/anxious.      has a past medical history of Hypertension and Reflux.   reports that he has never smoked. He has never used smokeless tobacco.  Past Surgical History:  Procedure Laterality Date  . CATARACT EXTRACTION    . RIGHT HEART CATH      No Known Allergies   There is no immunization history on file for this patient.  Family History  Problem Relation Age of Onset  . Hypertension Mother   . Ovarian cancer Mother   . Hypertension Father   . Pancreatic cancer Father   . Ovarian cancer  Maternal Grandmother   . Heart disease Maternal Grandfather   . Hypertension Sister   . Hypertension Brother      Current Outpatient Medications:  .  aspirin EC 81 MG tablet, Take 81 mg by mouth daily., Disp: , Rfl:  .  chlorthalidone (HYGROTON) 25 MG tablet, Take 25 mg by mouth daily., Disp: , Rfl:  .  dexlansoprazole (DEXILANT) 60 MG capsule, Take 60 mg by mouth daily., Disp: , Rfl:  .  levothyroxine (SYNTHROID, LEVOTHROID) 88 MCG tablet, Take 88 mcg by mouth daily before breakfast. , Disp: , Rfl:  .  metoprolol succinate (TOPROL-XL) 25 MG 24 hr tablet, Take 25 mg by mouth daily., Disp: , Rfl:  .  rosuvastatin (CRESTOR) 10 MG tablet, Take 10 mg by mouth daily., Disp: , Rfl:  .  spironolactone (ALDACTONE) 25 MG tablet, Take 25 mg by mouth daily., Disp: ,  Rfl:  .  telmisartan (MICARDIS) 80 MG tablet, Take 80 mg by mouth daily., Disp: , Rfl:  .  testosterone cypionate (DEPOTESTOSTERONE CYPIONATE) 200 MG/ML injection, Inject 1 mL into the muscle every 14 (fourteen) days., Disp: , Rfl:  .  budesonide-formoterol (SYMBICORT) 160-4.5 MCG/ACT inhaler, Inhale 2 puffs into the lungs 2 (two) times daily., Disp: , Rfl:      Objective:   Physical Exam  Constitutional: He is oriented to person, place, and time. He appears well-developed and well-nourished. No distress.  HENT:  Head: Normocephalic and atraumatic.  Right Ear: External ear normal.  Left Ear: External ear normal.  Mouth/Throat: Oropharynx is clear and moist. No oropharyngeal exudate.  Occasional clearing of the throat  Eyes: Pupils are equal, round, and reactive to light. Conjunctivae and EOM are normal. Right eye exhibits no discharge. Left eye exhibits no discharge. No scleral icterus.  Neck: Normal range of motion. Neck supple. No JVD present. No tracheal deviation present. No thyromegaly present.  Cardiovascular: Normal rate, regular rhythm and intact distal pulses. Exam reveals no gallop and no friction rub.  No murmur  heard. Pulmonary/Chest: Effort normal and breath sounds normal. No respiratory distress. He has no wheezes. He has no rales. He exhibits no tenderness.  Abdominal: Soft. Bowel sounds are normal. He exhibits no distension and no mass. There is no tenderness. There is no rebound and no guarding.  Musculoskeletal: Normal range of motion. He exhibits no edema or tenderness.  Lymphadenopathy:    He has no cervical adenopathy.  Neurological: He is alert and oriented to person, place, and time. He has normal reflexes. No cranial nerve deficit. Coordination normal.  Skin: Skin is warm and dry. No rash noted. He is not diaphoretic. No erythema. No pallor.  Psychiatric: He has a normal mood and affect. His behavior is normal. Judgment and thought content normal.  Nursing note and vitals reviewed.   Today's Vitals   07/23/17 0955  BP: 130/80  Pulse: 61  SpO2: 96%  Weight: 265 lb 12.8 oz (120.6 kg)  Height: 6\' 1"  (1.854 m)    Estimated body mass index is 35.07 kg/m as calculated from the following:   Height as of this encounter: 6\' 1"  (1.854 m).   Weight as of this encounter: 265 lb 12.8 oz (120.6 kg).       Assessment & Plan:     ICD-10-CM   1. 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  # Possible Sinus drainage  - start nasal steroid generic fluticasone inhaler 2 squirts each nostril daily as advised  - do CT sinus without contrast any time next few to several weekks   #Acid Reflux  - take otc zegerid 20mg   1 capsule daily on empty stomach or continue dexilant as before   -- At all times avoid colas, spices, cheeses, spirits, red meats, beer, chocolates, fried foods etc.,   - sleep with head end of bed elevated  - eat small frequent meals  - do not go to bed for 3 hours after last meal  #Possible Asthma   - no evidence on feno testing - do cbc with diff, and blood allergy profile to finish up testing - do this 07/23/2017 - till then albuterol 2 puff as needed - do HRCT supine and prone next few to several  weeks   #Cyclical cough/Irritable Larynx or Ciugh neuropathy  - please choose 2-3 days and observe complete voice rest - no talking or whispering  - at all times there  there is urge to cough, drink water or swallow or sip on throat lozenge - see Mr Garald Balding speech therapist - Take gabapentin 300mg  once daily x 5 days, then 300mg  twice daily x 5 days, then 300mg  three times daily to continue. If this makes you too sleepy or drowsy call us and we will cut your medication dosing down    #Followup - I will see you in 4-6 weeks weeks.  - any problems call or come sooner - RSI cough score at followup    Adam. Brand Males, M.D., [facility].C.P Pulmonary and Critical Care Medicine Staff Physician, [place] Director - Interstitial Lung Disease  Program  Pulmonary [place] at [place], [place], [address]  Pager: [phone], If no answer or between  15:00h - 7:00h: call [phone] Telephone: [phone]

## 2017-07-23 NOTE — Patient Instructions (Signed)
ICD-10-CM   1. 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  # Possible Sinus drainage  - start nasal steroid generic fluticasone inhaler 2 squirts each nostril daily as advised  - do CT sinus without contrast any time next few to several weekks   #Acid Reflux  - take otc zegerid 20mg   1 capsule daily on empty stomach or continue dexilant as before   -- At all times avoid colas, spices, cheeses, spirits, red meats, beer, chocolates, fried foods etc.,   - sleep with head end of bed elevated  - eat small frequent meals  - do not go to bed for 3 hours after last meal  #Possible Asthma  - no evidence on feno testing - do cbc with diff, and blood allergy profile to finish up testing - do this 07/23/2017 - till then albuterol 2 puff as needed - do HRCT supine and prone next few to several weeks   #Cyclical cough/Irritable Larynx or Ciugh neuropathy  - please choose 2-3 days and observe complete voice rest - no talking or whispering  - at all times there  there is urge to cough, drink water or swallow or sip on throat lozenge - see Mr Garald Balding speech therapist - Take gabapentin 300mg  once daily x 5 days, then 300mg  twice daily x 5 days, then 300mg  three times daily to continue. If this makes you too sleepy or drowsy call us and we will cut your medication dosing down    #Followup - I will see you in 4-6 weeks weeks.  - any problems call or come sooner - RSI cough score at followup

## 2017-07-23 NOTE — Addendum Note (Signed)
Addended by: Lorretta Harp on: 07/23/2017 05:04 PM   Modules accepted: Orders

## 2017-07-24 ENCOUNTER — Encounter: Payer: Self-pay | Admitting: Internal Medicine

## 2017-07-24 LAB — RESPIRATORY ALLERGY PROFILE REGION II ~~LOC~~
Allergen, A. alternata, m6: <0.1 kU/L
Allergen, Cedar tree, t12: <0.1 kU/L
Allergen, Comm Silver Birch, t9: <0.1 kU/L
Allergen, Cottonwood, t14: <0.1 kU/L
Allergen, D pternoyssinus,d7: <0.1 kU/L
Allergen, Mulberry, t76: <0.1 kU/L
Allergen, Oak,t7: <0.1 kU/L
Aspergillus fumigatus, m3: <0.1 kU/L
Bermuda Grass: <0.1 kU/L
CLADOSPORIUM HERBARUM (M2) IGE: <0.1 kU/L
CLASS: 0
CLASS: 0
CLASS: 0
CLASS: 0
CLASS: 0
CLASS: 0
CLASS: 0
CLASS: 0
Cat Dander: <0.1 kU/L
Class: 0
Class: 0
Class: 0
Class: 0
Class: 0
Class: 0
Class: 0
Class: 0
Class: 0
Class: 0
Class: 0
Class: 0
Class: 0
Class: 0
Class: 0
Class: 0
Cockroach: <0.1 kU/L
Dog Dander: <0.1 kU/L
IGE (IMMUNOGLOBULIN E), SERUM: 3 kU/L (ref <=114)
Sheep Sorrel IgE: <0.1 kU/L
Timothy Grass: <0.1 kU/L

## 2017-07-24 LAB — INTERPRETATION:

## 2017-08-10 ENCOUNTER — Telehealth: Payer: Self-pay | Admitting: Internal Medicine

## 2017-08-10 MED ORDER — GABAPENTIN 300 MG PO CAPS: ORAL_CAPSULE | ORAL | 5 refills | Status: DC

## 2017-08-10 NOTE — Telephone Encounter (Signed)
Spoke with pt. He is needing a refill on Gabapentin. Rx has been sent in. Nothing further was needed.

## 2017-08-24 ENCOUNTER — Other Ambulatory Visit: Payer: Self-pay | Admitting: Internal Medicine

## 2017-08-27 ENCOUNTER — Telehealth: Payer: Self-pay | Admitting: Internal Medicine

## 2017-08-27 ENCOUNTER — Encounter: Payer: Self-pay | Admitting: Internal Medicine

## 2017-08-27 ENCOUNTER — Ambulatory Visit (INDEPENDENT_AMBULATORY_CARE_PROVIDER_SITE_OTHER): Payer: Commercial Managed Care - PPO | Admitting: Internal Medicine

## 2017-08-27 VITALS — BP 142/74 | HR 54 | Ht 73.0 in | Wt 263.4 lb

## 2017-08-27 DIAGNOSIS — K219 Gastro-esophageal reflux disease without esophagitis: Secondary | ICD-10-CM

## 2017-08-27 DIAGNOSIS — J387 Other diseases of larynx: Secondary | ICD-10-CM | POA: Diagnosis not present

## 2017-08-27 DIAGNOSIS — R05 Cough: Secondary | ICD-10-CM

## 2017-08-27 DIAGNOSIS — R053 Chronic cough: Secondary | ICD-10-CM

## 2017-08-27 DIAGNOSIS — J984 Other disorders of lung: Secondary | ICD-10-CM | POA: Diagnosis not present

## 2017-08-27 NOTE — Telephone Encounter (Signed)
ATC Patient.  Left message for him to call back for results.

## 2017-08-27 NOTE — Telephone Encounter (Signed)
Patient returned phone call; pt contact # [phone]

## 2017-08-27 NOTE — Progress Notes (Signed)
Subjective:     Patient ID: Adam [name], male   DOB: 1955/09/18, 62 y.o.   MRN: [id number]  HPI  PCP Myer Peer, MD   HPI  IOV 07/23/2017  Chief Complaint  Patient presents with  . Consult    Self referral due to cough and wheezing that pt states has been happening x1 year. Denies any complaints of SOB or CP.    Adam [name] , 62 y.o. , with dob Dec 20, 1955 and male ,Not Hispanic or Latino from [place] [address] - presents to lung clinic for cough chronic eval. He is a friend of my patient Mr. 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 Patient is here with his wife. He reports insidious onset of chronic cough for the last few to several years. He does not know how it started but suspects that he's had episodes of whooping cough in the pastand this cough has lingered since then. The cough is essentially dry in quality but occasionally associated with sputum production that is clear. He feels the cough comes from his throat. It is variable in severity from mild to severe. He feels quality and severity of the cough is deteriorated in the last few to several months. It is associated with clearing of the throat and very occasional wheezing but no shortness of breath or edema or orthopnea. The cough does not happen at night. It happens only in the daytime. Aggravated by talking or laughing and sometimes by indoors. Relieved by rest and drinking water and staying quite. There is no associated radiation. Severity index is documented below and the RSI cough score.  There is no associated ACE inhibitor intake. He does have acid reflux for which he takes PPIwith mostly good control. He does drink a lot of coffee. He associates himself with some postnasal drip.He's had allergy testing many years ago. He has dogs at home. Apparently allergy test was negative. Exhaled nitric oxide today was 21 ppb and argues against asthma. He does take Symbicort as needed     Personally visualized chest x-ray  in 2005 in 2017 and both look clear  Stress echocardiogram in April 2013 shows mild aortic and mitral regurgitation with ejection fraction 60%  He had a spirometry May 2017 that I personally visualized and it looks normal with normal bronchodilator response    OV 08/27/2017  Chief Complaint  Patient presents with  . Follow-up    Pt states he believes his cough is some better since last visit. Denies any complaints of SOB or CP.    Follow-up chronic cough  Last visit we started gabapentin but he only took it for 3 weeks because of, see refill issues. With pharmacy. His cough is improved only somewhat RSI cough scoreis 10. He thinks the gabapentin did help him. He did not attend speech therapy because he is concerned about travel all the way from [facility]. He did have high resolution CT chest that I personally visualized and does not show any lung cancer of fibrosis or pneumonia or emphysema. He has some air trapping suggestive of small airways disease. He had allergy workup in the blood and this was negative. His exhaled nitric oxide was negative but he tells me that Symbicort taken as needed for cough with wheezing does help him. He admits to significant acid reflux despite PPI. His wife reports that he eats too many carbohydrates.  CT sinus don7/22/19 at [facility] - not seein in PACS. Results awaited  Dr  Kouffman Reflux Symptom Index (> 13-15 suggestive of LPR cough) July 2019 08/27/2017   Hoarseness of problem with voice 1 1  Clearing  Of Throat 2 3  Excess throat mucus or feeling of post nasal drip 2 2  Difficulty swallowing food, liquid or tablets 0 0  Cough after eating or lying down 0 0  Breathing difficulties or choking episodes 0 0  Troublesome or annoying cough 4 2  Sensation of something sticking in throat or lump in throat 2 0  Heartburn, chest pain, indigestion, or stomach acid coming up 2 2  TOTAL 13 10        has a past medical history of Hypertension and  Reflux.   reports that he has never smoked. He has never used smokeless tobacco.  Past Surgical History:  Procedure Laterality Date  . CATARACT EXTRACTION    . RIGHT HEART CATH      No Known Allergies   There is no immunization history on file for this patient.  Family History  Problem Relation Age of Onset  . Hypertension Mother   . Ovarian cancer Mother   . Hypertension Father   . Pancreatic cancer Father   . Ovarian cancer Maternal Grandmother   . Heart disease Maternal Grandfather   . Hypertension Sister   . Hypertension Brother      Current Outpatient Medications:  .  albuterol (PROVENTIL HFA;VENTOLIN HFA) 108 (90 Base) MCG/ACT inhaler, Inhale 2 puffs into the lungs every 6 (six) hours as needed for wheezing or shortness of breath., Disp: 1 Inhaler, Rfl: 6 .  aspirin EC 81 MG tablet, Take 81 mg by mouth daily., Disp: , Rfl:  .  budesonide-formoterol (SYMBICORT) 160-4.5 MCG/ACT inhaler, Inhale 2 puffs into the lungs 2 (two) times daily., Disp: , Rfl:  .  chlorthalidone (HYGROTON) 25 MG tablet, Take 25 mg by mouth daily., Disp: , Rfl:  .  dexlansoprazole (DEXILANT) 60 MG capsule, Take 60 mg by mouth daily., Disp: , Rfl:  .  gabapentin (NEURONTIN) 300 MG capsule, Take 1 capsule (300 mg total) by mouth 3 (three) times daily., Disp: 60 capsule, Rfl: 2 .  levothyroxine (SYNTHROID, LEVOTHROID) 88 MCG tablet, Take 88 mcg by mouth daily before breakfast. , Disp: , Rfl:  .  metoprolol succinate (TOPROL-XL) 25 MG 24 hr tablet, Take 25 mg by mouth daily., Disp: , Rfl:  .  rosuvastatin (CRESTOR) 10 MG tablet, Take 10 mg by mouth daily., Disp: , Rfl:  .  spironolactone (ALDACTONE) 25 MG tablet, Take 25 mg by mouth daily., Disp: , Rfl:  .  telmisartan (MICARDIS) 80 MG tablet, Take 80 mg by mouth daily., Disp: , Rfl:  .  testosterone cypionate (DEPOTESTOSTERONE CYPIONATE) 200 MG/ML injection, Inject 1 mL into the muscle every 14 (fourteen) days., Disp: , Rfl:      Review of  Systems     Objective:   Physical Exam  Constitutional: He is oriented to person, place, and time. He appears well-developed and well-nourished. No distress.  HENT:  Head: Normocephalic and atraumatic.  Right Ear: External ear normal.  Left Ear: External ear normal.  Mouth/Throat: Oropharynx is clear and moist. No oropharyngeal exudate.  Eyes: Pupils are equal, round, and reactive to light. Conjunctivae and EOM are normal. Right eye exhibits no discharge. Left eye exhibits no discharge. No scleral icterus.  Neck: Normal range of motion. Neck supple. No JVD present. No tracheal deviation present. No thyromegaly present.  Cardiovascular: Normal rate, regular rhythm and intact distal pulses.  Exam reveals no gallop and no friction rub.  No murmur heard. Pulmonary/Chest: Effort normal and breath sounds normal. No respiratory distress. He has no wheezes. He has no rales. He exhibits no tenderness.  Abdominal: Soft. Bowel sounds are normal. He exhibits no distension and no mass. There is no tenderness. There is no rebound and no guarding.  Musculoskeletal: Normal range of motion. He exhibits no edema or tenderness.  Lymphadenopathy:    He has no cervical adenopathy.  Neurological: He is alert and oriented to person, place, and time. He has normal reflexes. No cranial nerve deficit. Coordination normal.  Skin: Skin is warm and dry. No rash noted. He is not diaphoretic. No erythema. No pallor.  Psychiatric: He has a normal mood and affect. His behavior is normal. Judgment and thought content normal.  Nursing note and vitals reviewed.  Vitals:   08/27/17 0930  BP: (!) 142/74  Pulse: (!) 54  SpO2: 98%  Weight: 263 lb 6.4 oz (119.5 kg)  Height: 6\' 1"  (1.854 m)    Estimated body mass index is 34.75 kg/m as calculated from the following:   Height as of this encounter: 6\' 1"  (1.854 m).   Weight as of this encounter: 263 lb 6.4 oz (119.5 kg).      Assessment:       ICD-10-CM   1. 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 Gastroesophageal reflux disease, esophagitis presence not specified K21.9        Plan:     Chronic cough Irritable larynx Small airways disease Gastroesophageal reflux disease, esophagitis presence not specified   Cough is from ,acid reflux, possible smallairway disease, and irritable larynx aka cough neuropathy No evidence of allergies CT does not show evidence of pulmonary fibrosis  or lung cancer or emphysema  PLAN #Acid Reflux  -take PPI of choice   -- At all times avoid colas, spices, cheeses, spirits, red meats, beer, chocolates, fried foods etc.,  - avoid carb  - sleep with head end of bed elevated  - eat small frequent meals  - do not go to bed for 3 hours after last meal  #small airway disease -  - start Symbicort 2 puff 2 times daily   #Cyclical cough/Irritable Larynx or Ciugh neuropathy  - please choose 2-3 days and observe complete voice rest - no talking or whispering  - at all times there  there is urge to cough, drink water or swallow or sip on throat lozenge - see Mr Garald Balding speech therapist - Restart gabapentin 300mg  once daily x 5 days, then 300mg  twice daily x 5 days, then 300mg  three times daily to continue. If this makes you too sleepy or drowsy call us and we will cut your medication dosing down  #Others - complete CT sinus at [facility] -await results done 07/27/17   #Followup - I will see you in8 weeks weeks.  - any problems call or come sooner - RSI cough score at followup  > 50% of this > 25 min visit spent in face to face counseling or coordination of care - by this undersigned MD - Dr Brand Males. This includes one or more of the following documented above: discussion of test results, diagnostic or treatment recommendations, prognosis, risks and benefits of management options, instructions, education, compliance or risk-factor reduction   Dr. Brand Males, M.D., [facility].C.P Pulmonary and Critical Care Medicine Staff Physician, [facility]  Director - Interstitial Lung Disease  Program  Pulmonary [place] at [facility], [place], [address]  Pager: [phone], If no answer or between  15:00h - 7:00h: call [phone] Telephone: [phone]

## 2017-08-27 NOTE — Telephone Encounter (Signed)
Pt returned call. Informed him of the results per MR. Pt verbalized understanding and denied any further questions or concerns at this time.

## 2017-08-27 NOTE — Telephone Encounter (Signed)
Pt is returning call about results. Cb is [phone]

## 2017-08-27 NOTE — Addendum Note (Signed)
Addended by: Lorretta Harp on: 08/27/2017 10:11 AM   Modules accepted: Orders

## 2017-08-27 NOTE — Patient Instructions (Addendum)
Chronic cough Irritable larynx Small airways disease Gastroesophageal reflux disease, esophagitis presence not specified   Cough is from ,acid reflux, possible smallairway disease, and irritable larynx aka cough neuropathy No evidence of allergies CT does not show evidence of pulmonary fibrosis  or lung cancer or emphysema  PLAN #Acid Reflux  -take PPI of choice   -- At all times avoid colas, spices, cheeses, spirits, red meats, beer, chocolates, fried foods etc.,  - avoid carb  - sleep with head end of bed elevated  - eat small frequent meals  - do not go to bed for 3 hours after last meal  #small airway disease -  - start Symbicort 2 puff 2 times daily   #Cyclical cough/Irritable Larynx or Ciugh neuropathy  - please choose 2-3 days and observe complete voice rest - no talking or whispering  - at all times there  there is urge to cough, drink water or swallow or sip on throat lozenge - see Mr Garald Balding speech therapist - Restart gabapentin 300mg  once daily x 5 days, then 300mg  twice daily x 5 days, then 300mg  three times daily to continue. If this makes you too sleepy or drowsy call us and we will cut your medication dosing down  it  #Followup - I will see you in8 weeks weeks.  - any problems call or come sooner - RSI cough score at followup

## 2017-08-27 NOTE — Telephone Encounter (Signed)
Received the results from [facility] of pt's CT sinus.  Performed 07/27/17.  Findings: there is mild mucosal thickening in the alveolar recess of the right maxillary sinus. The visualized portions of the other paranasal sinuses are clear. No fluid levels are present.   Impression: mild right maxillary sinus mucosal thickening. No fluid.   Called pt to relay the results but unable to reach pt.  Left message for pt to return call x1.

## 2017-10-23 ENCOUNTER — Ambulatory Visit: Payer: Commercial Managed Care - PPO | Admitting: Internal Medicine

## 2017-10-23 ENCOUNTER — Encounter: Payer: Self-pay | Admitting: Internal Medicine

## 2017-10-23 VITALS — BP 142/88 | HR 63 | Ht 73.0 in | Wt 267.4 lb

## 2017-10-23 DIAGNOSIS — R053 Chronic cough: Secondary | ICD-10-CM

## 2017-10-23 DIAGNOSIS — R05 Cough: Secondary | ICD-10-CM

## 2017-10-23 DIAGNOSIS — K219 Gastro-esophageal reflux disease without esophagitis: Secondary | ICD-10-CM | POA: Diagnosis not present

## 2017-10-23 DIAGNOSIS — J984 Other disorders of lung: Secondary | ICD-10-CM | POA: Diagnosis not present

## 2017-10-23 DIAGNOSIS — J387 Other diseases of larynx: Secondary | ICD-10-CM | POA: Diagnosis not present

## 2017-10-23 LAB — POCT EXHALED NITRIC OXIDE: FeNO level (ppb): 33

## 2017-10-23 NOTE — Patient Instructions (Signed)
ICD-10-CM   1. Chronic cough R05 POCT EXHALED NITRIC OXIDE  2. 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 Gastroesophageal reflux disease, esophagitis presence not specified K21.9     Glad you are feeling better Glad you are up-to-date with the flu shot  Plan -Rerefer voice rehab Mr. Garald Balding -Continue acid reflux therapy -Okay to continue Symbicort as needed because I do not see any benefit with this -Start tapering gabapentin as follows  -300 mg twice daily through November 20, 2017  -Then, 300 mg once daily through December 20, 2017  -Then 300 mg on Monday Wednesday Friday through January 20, 2017  -Then 300 mg once a week on Mondays only through February 20, 2017 and stop  Followup  -3 months or sooner if needed

## 2017-10-23 NOTE — Progress Notes (Signed)
HPI  PCP Adam Peer, MD   HPI  IOV 07/23/2017  Chief Complaint  Patient presents with  . Consult    Self referral due to cough and wheezing that pt states has been happening x1 year. Denies any complaints of SOB or CP.    Adam [name] , 62 y.o. , with dob 20-Jan-1955 and [name] ,Not Hispanic or Latino from [place] [address] - presents to lung clinic for cough chronic eval. He is a friend of my patient Adam [name] who suffers from IPF. Patient is here with his wife. He reports insidious onset of chronic cough for the last few to several years. He does not know how it started but suspects that he's had episodes of whooping cough in the pastand this cough has lingered since then. The cough is essentially dry in quality but occasionally associated with sputum production that is clear. He feels the cough comes from his throat. It is variable in severity from mild to severe. He feels quality and severity of the cough is deteriorated in the last few to several months. It is associated with clearing of the throat and very occasional wheezing but no shortness of breath or edema or orthopnea. The cough does not happen at night. It happens only in the daytime. Aggravated by talking or laughing and sometimes by indoors. Relieved by rest and drinking water and staying quite. There is no associated radiation. Severity index is documented below and the RSI cough score.  There is no associated ACE inhibitor intake. He does have acid reflux for which he takes PPIwith mostly good control. He does drink a lot of coffee. He associates himself with some postnasal drip.He's had allergy testing many years ago. He has dogs at home. Apparently allergy test was negative. Exhaled nitric oxide today was 21 ppb and argues against asthma. He does take Symbicort as needed     Personally visualized chest x-ray in 2005 in 2017 and both look clear  Stress echocardiogram in April 2013 shows mild  aortic and mitral regurgitation with ejection fraction 60%  He had a spirometry May 2017 that I personally visualized and it looks normal with normal bronchodilator response    OV 08/27/2017  Chief Complaint  Patient presents with  . Follow-up    Pt states he believes his cough is some better since last visit. Denies any complaints of SOB or CP.    Follow-up chronic cough  Last visit we started gabapentin but he only took it for 3 weeks because of, see refill issues. With pharmacy. His cough is improved only somewhat RSI cough scoreis 10. He thinks the gabapentin did help him. He did not attend speech therapy because he is concerned about travel all the way [facility]. He did have high resolution CT chest that I personally visualized and does not show any lung cancer of fibrosis or pneumonia or emphysema. He has some air trapping suggestive of small airways disease. He had allergy workup in the blood and this was negative. His exhaled nitric oxide was negative but he tells me that Symbicort taken as needed for cough with wheezing does help him. He admits to significant acid reflux despite PPI. His wife reports that he eats too many carbohydrates.  CT sinus don7/22/19 at [facility]- - not seein in PACS. Results awaited      OV 10/23/2017  Subjective:  Patient ID: Adam [name] , DOB: 25-Sep-1955 , age  62 y.o. , MRN: [id number] , ADDRESS: [address] [facility] [address]   10/23/2017 -   Chief Complaint  Patient presents with  . Follow-up    Chronic Cough, states the cough is getting better does have occasional throat irritation,     HPI Adam [name] 62 y.o. -returns for follow-up of chronic cough.  He tells me the gabapentin has helped him significantly.  The cough is significantly better.  He still does have occasional irritable larynx.  He says voice rehab never reached out to him.  This is only part of the therapy has not done yet.  He has not taken gabapentin  for 2 months and he feels he is ready to taper it slowly and come off.  He did try Symbicort but this did make the cough worse.  So he only takes it as needed.  There are no new issues.  He is up-to-date with the flu shot   Dr Adam [name] Reflux Symptom Index (> 13-15 suggestive of LPR cough) July 2019 08/27/2017  10/23/2017 feno 33  Hoarseness of problem with voice 1 1 2   Clearing  Of Throat 2 3 2   Excess throat mucus or feeling of post nasal drip 2 2 2   Difficulty swallowing food, liquid or tablets 0 0 0  Cough after eating or lying down 0 0 0  Breathing difficulties or choking episodes 0 0 0  Troublesome or annoying cough 4 2 2   Sensation of something sticking in throat or lump in throat 2 0 0  Heartburn, chest pain, indigestion, or stomach acid coming up 2 2 0  TOTAL 13 10  8     Results for Adam [name] (MRN [id number]) as of 10/23/2017 09:33  Ref. Range 07/23/2017 17:03 10/23/2017   Nitric Oxide Unknown 21 33    ROS - per HPI     has a past medical history of Hypertension and Reflux.   reports that he has never smoked. He has never used smokeless tobacco.  Past Surgical History:  Procedure Laterality Date  . CATARACT EXTRACTION    . RIGHT HEART CATH      No Known Allergies  Immunization History  Administered Date(s) Administered  . Influenza-Unspecified 10/12/2017    Family History  Problem Relation Age of Onset  . Hypertension Mother   . Ovarian cancer Mother   . Hypertension Father   . Pancreatic cancer Father   . Ovarian cancer Maternal Grandmother   . Heart disease Maternal Grandfather   . Hypertension Sister   . Hypertension Brother      Current Outpatient Medications:  .  albuterol (PROVENTIL HFA;VENTOLIN HFA) 108 (90 Base) MCG/ACT inhaler, Inhale 2 puffs into the lungs every 6 (six) hours as needed for wheezing or shortness of breath., Disp: 1 Inhaler, Rfl: 6 .  aspirin EC 81 MG tablet, Take 81 mg by mouth daily., Disp: , Rfl:  .   budesonide-formoterol (SYMBICORT) 160-4.5 MCG/ACT inhaler, Inhale 2 puffs into the lungs 2 (two) times daily., Disp: , Rfl:  .  chlorthalidone (HYGROTON) 25 MG tablet, Take 25 mg by mouth daily., Disp: , Rfl:  .  dexlansoprazole (DEXILANT) 60 MG capsule, Take 60 mg by mouth daily., Disp: , Rfl:  .  gabapentin (NEURONTIN) 300 MG capsule, Take 1 capsule (300 mg total) by mouth 3 (three) times daily., Disp: 60 capsule, Rfl: 2 .  levothyroxine (SYNTHROID, LEVOTHROID) 88 MCG tablet, Take 88 mcg by mouth daily before breakfast. , Disp: , Rfl:  .  metoprolol succinate (TOPROL-XL) 25 MG 24 hr tablet, Take 25 mg by mouth daily., Disp: , Rfl:  .  rosuvastatin (CRESTOR) 10 MG tablet, Take 10 mg by mouth daily., Disp: , Rfl:  .  spironolactone (ALDACTONE) 25 MG tablet, Take 25 mg by mouth daily., Disp: , Rfl:  .  telmisartan (MICARDIS) 80 MG tablet, Take 80 mg by mouth daily., Disp: , Rfl:  .  testosterone cypionate (DEPOTESTOSTERONE CYPIONATE) 200 MG/ML injection, Inject 1 mL into the muscle every 14 (fourteen) days., Disp: , Rfl:       Objective:   Vitals:   10/23/17 0909  BP: (!) 142/88  Pulse: 63  SpO2: 95%  Weight: 267 lb 6.4 oz (121.3 kg)  Height: 6\' 1"  (1.854 m)    Estimated body mass index is 35.28 kg/m as calculated from the following:   Height as of this encounter: 6\' 1"  (1.854 m).   Weight as of this encounter: 267 lb 6.4 oz (121.3 kg).  @WEIGHTCHANGE @  [employer]   10/23/17 0909  Weight: 267 lb 6.4 oz (121.3 kg)     Physical Exam  General Appearance:    Alert, cooperative, no distress, appears stated age - yes , Deconditioned looking - no , OBESE  - yes, Sitting on Wheelchair -  no  Head:    Normocephalic, without obvious abnormality, atraumatic  Eyes:    PERRL, conjunctiva/corneas clear,  Ears:    Normal TM's and external ear canals, both ears  Nose:   Nares normal, septum midline, mucosa normal, no drainage    or sinus tenderness. OXYGEN ON  - no . Patient is @ ra     Throat:   Lips, mucosa, and tongue normal; teeth and gums normal. Cyanosis on lips - no  Neck:   Supple, symmetrical, trachea midline, no adenopathy;    thyroid:  no enlargement/tenderness/nodules; no carotid   bruit or JVD  Back:     Symmetric, no curvature, ROM normal, no CVA tenderness  Lungs:     Distress - no , Wheeze no, Barrell Chest - no, Purse lip breathing - no, Crackles - no   Chest Wall:    No tenderness or deformity.    Heart:    Regular rate and rhythm, S1 and S2 normal, no rub   or gallop, Murmur - no  Breast Exam:    NOT DONE  Abdomen:     Soft, non-tender, bowel sounds active all four quadrants,    no masses, no organomegaly. Visceral obesity - yes  Genitalia:   NOT DONE  Rectal:   NOT DONE  Extremities:   Extremities - normal, Has Cane - no, Clubbing - no, Edema - no  Pulses:   2+ and symmetric all extremities  Skin:   Stigmata of Connective Tissue Disease - no  Lymph nodes:   Cervical, supraclavicular, and axillary nodes normal  Psychiatric:  Neurologic:   Pleasant - yes, Anxious - no, Flat affect - no  CAm-ICU - neg, Alert and Oriented x 3 - yes, Moves all 4s - yes, Speech - normal, Cognition - intact           Assessment:       ICD-10-CM   1. Chronic cough R05 POCT EXHALED NITRIC OXIDE  2. 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 Gastroesophageal reflux disease, esophagitis presence not specified K21.9        Plan:     Patient Instructions     ICD-10-CM  1. Chronic cough R05 POCT EXHALED NITRIC OXIDE  2. 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 Gastroesophageal reflux disease, esophagitis presence not specified K21.9     Glad you are feeling better Glad you are up-to-date with the flu shot  Plan -Rerefer voice rehab Mr. Garald Balding -Continue acid reflux therapy -Okay to continue Symbicort as needed because I do not see any benefit with this -Start tapering gabapentin as follows  -300 mg twice  daily through November 20, 2017  -Then, 300 mg once daily through December 20, 2017  -Then 300 mg on Monday Wednesday Friday through January 20, 2017  -Then 300 mg once a week on Mondays only through February 20, 2017 and stop  Followup  -3 months or sooner if needed      SIGNATURE    Dr. Brand Males, M.D., F.C.C.P,  Pulmonary and Critical Care Medicine Staff Physician, [place] Director - Interstitial Lung Disease  Program  Pulmonary [place] [facility] [place], [place], [address]  Pager: [phone], If no answer or between  15:00h - 7:00h: call [phone] Telephone: [phone]  9:47 AM 10/23/2017

## 2017-11-23 DIAGNOSIS — N62 Hypertrophy of breast: Secondary | ICD-10-CM

## 2017-11-23 HISTORY — DX: Hypertrophy of breast: N62

## 2018-01-26 ENCOUNTER — Encounter: Payer: Self-pay | Admitting: Internal Medicine

## 2018-01-26 ENCOUNTER — Ambulatory Visit: Payer: Commercial Managed Care - PPO | Admitting: Internal Medicine

## 2018-01-26 VITALS — BP 122/78 | HR 76 | Ht 73.0 in | Wt 270.4 lb

## 2018-01-26 DIAGNOSIS — J387 Other diseases of larynx: Secondary | ICD-10-CM | POA: Diagnosis not present

## 2018-01-26 DIAGNOSIS — R05 Cough: Secondary | ICD-10-CM

## 2018-01-26 DIAGNOSIS — J209 Acute bronchitis, unspecified: Secondary | ICD-10-CM

## 2018-01-26 DIAGNOSIS — R053 Chronic cough: Secondary | ICD-10-CM

## 2018-01-26 NOTE — Patient Instructions (Signed)
ICD-10-CM   1. Acute bronchitis, unspecified organism J20.9   2. Chronic cough R05   3. Irritable larynx J38.7    Gradually improving acute bronchitis making chronic cough worse  Glad you are  significantly better  Plan -Allow for natural resolution of cough over the next few to several weeks -You can help the process by talking less, drinking water, chewing on sugarless lozenge, taking Mucinex and occasional albuterol as needed -If your cough is not resolved or significantly better in the next 3 to 4 weeks please call us and we can advise you on taking (restarting] -short course of 4-8 weeks of gabapentin  Follow-up 6-9 months or sooner if needed

## 2018-01-26 NOTE — Progress Notes (Signed)
HPI  PCP Myer Peer, MD   HPI  IOV 07/23/2017  Chief Complaint  Patient presents with  . Consult    Self referral due to cough and wheezing that pt states has been happening x1 year. Denies any complaints of SOB or CP.    Thompson Mckim Duch , 63 y.o. , with dob Jul 03, 1955 and male ,Not Hispanic or Latino from [place] [id number] - presents to lung clinic for cough chronic eval. He is a friend of my patient [name] who suffers from IPF. Patient is here with his wife. He reports insidious onset of chronic cough for the last few to several years. He does not know how it started but suspects that he's had episodes of whooping cough in the pastand this cough has lingered since then. The cough is essentially dry in quality but occasionally associated with sputum production that is clear. He feels the cough comes from his throat. It is variable in severity from mild to severe. He feels quality and severity of the cough is deteriorated in the last few to several months. It is associated with clearing of the throat and very occasional wheezing but no shortness of breath or edema or orthopnea. The cough does not happen at night. It happens only in the daytime. Aggravated by talking or laughing and sometimes by indoors. Relieved by rest and drinking water and staying quite. There is no associated radiation. Severity index is documented below and the RSI cough score.  There is no associated ACE inhibitor intake. He does have acid reflux for which he takes PPIwith mostly good control. He does drink a lot of coffee. He associates himself with some postnasal drip.He's had allergy testing many years ago. He has dogs at home. Apparently allergy test was negative. Exhaled nitric oxide today was 21 ppb and argues against asthma. He does take Symbicort as needed     Personally visualized chest x-ray in 2005 in 2017 and both look clear  Stress echocardiogram in April 2013 shows mild  aortic and mitral regurgitation with ejection fraction 60%  He had a spirometry May 2017 that I personally visualized and it looks normal with normal bronchodilator response    OV 08/27/2017  Chief Complaint  Patient presents with  . Follow-up    Pt states he believes his cough is some better since last visit. Denies any complaints of SOB or CP.    Follow-up chronic cough  Last visit we started gabapentin but he only took it for 3 weeks because of, see refill issues. With pharmacy. His cough is improved only somewhat RSI cough scoreis 10. He thinks the gabapentin did help him. He did not attend speech therapy because he is concerned about travel all the way from [facility]. He did have high resolution CT chest that I personally visualized and does not show any lung cancer of fibrosis or pneumonia or emphysema. He has some air trapping suggestive of small airways disease. He had allergy workup in the blood and this was negative. His exhaled nitric oxide was negative but he tells me that Symbicort taken as needed for cough with wheezing does help him. He admits to significant acid reflux despite PPI. His wife reports that he eats too many carbohydrates.  CT sinus don7/22/19 at [facility] - not seein in PACS. Results awaited      OV 10/23/2017  Subjective:  Patient ID: Kem Parkinson, male , DOB: 1955/08/28 , age 52  y.o. , MRN: [id number] , ADDRESS: [place] [facility] [id number]   10/23/2017 -   Chief Complaint  Patient presents with  . Follow-up    Chronic Cough, states the cough is getting better does have occasional throat irritation,     HPI Jayshun A Chura 63 y.o. -returns for follow-up of chronic cough.  He tells me the gabapentin has helped him significantly.  The cough is significantly better.  He still does have occasional irritable larynx.  He says voice rehab never reached out to him.  This is only part of the therapy has not done yet.  He has not taken gabapentin  for 2 months and he feels he is ready to taper it slowly and come off.  He did try Symbicort but this did make the cough worse.  So he only takes it as needed.  There are no new issues.  He is up-to-date with the flu shot   OV 01/26/2018  Subjective:  Patient ID: Kem Parkinson, male , DOB: 11/21/1955 , age 25 y.o. , MRN: [id number] , ADDRESS: [place] [facility] [address]   01/26/2018 -   Chief Complaint  Patient presents with  . Follow-up    Pt states he had been doing good up until the first of the year and stated he came down with bronchitis. Pt has complaints of cough with clear mucus, sore in chest from coughing. Denies any fever.     HPI Jordynn A Seider 63 y.o. -routine follow-up for chronic cough.  At last visit he told me Symbicort was not helping him.  His exam nitric oxide were not consistent with asthma.  Gabapentin helped him and he was doing a taper.  He is able to supposed to run into February 2020 but he tells me that right around the end of 2019 he stopped his gabapentin.  He subsequently picked up a viral infection from his wife and was diagnosed with acute bronchitis in the first part of the year.  It is now been 3 weeks.  He is left with a residual cough although he admits that it is improving.  Still the severity of it is much worse than previous cough.  It is dry in nature.  He feels there is some mucus he is unable to bring out but otherwise dry.  It does not wake him up at night although occasionally it can.  He is try Symbicort but paradoxically for a short time it can make the cough worse just like before.  He is having to clear the throat a lot.  He is willing to try gabapentin again if we recommended so.  Nevertheless the overall trajectory of the cough is 1 of improvement     Dr Lorenza [place] Reflux Symptom Index (> 13-15 suggestive of LPR cough) July 2019 feno 21 - initial eval 08/27/2017  10/23/2017 feno 33 01/26/2018 Acute bronchitis improving  Hoarseness of problem  with voice 1 1 2 4   Clearing  Of Throat 2 3 2 4   Excess throat mucus or feeling of post nasal drip 2 2 2 4   Difficulty swallowing food, liquid or tablets 0 0 0 1  Cough after eating or lying down 0 0 0 3  Breathing difficulties or choking episodes 0 0 0 0  Troublesome or annoying cough 4 2 2 4   Sensation of something sticking in throat or lump in throat 2 0 0 4  Heartburn, chest pain, indigestion, or stomach acid coming up 2  2 0 3  TOTAL 13 10  8 27     Results for DEREN, DEGRAZIA (MRN [id number]) as of 10/23/2017 09:33  Ref. Range 07/23/2017 17:03 10/23/2017     Nitric Oxide Unknown 21 33     ROS - per HPI     has a past medical history of Hypertension and Reflux.   reports that he has never smoked. He has never used smokeless tobacco.  Past Surgical History:  Procedure Laterality Date  . CATARACT EXTRACTION    . RIGHT HEART CATH      No Known Allergies  Immunization History  Administered Date(s) Administered  . Influenza-Unspecified 10/12/2017    Family History  Problem Relation Age of Onset  . Hypertension Mother   . Ovarian cancer Mother   . Hypertension Father   . Pancreatic cancer Father   . Ovarian cancer Maternal Grandmother   . Heart disease Maternal Grandfather   . Hypertension Sister   . Hypertension Brother      Current Outpatient Medications:  .  albuterol (PROVENTIL HFA;VENTOLIN HFA) 108 (90 Base) MCG/ACT inhaler, Inhale 2 puffs into the lungs every 6 (six) hours as needed for wheezing or shortness of breath., Disp: 1 Inhaler, Rfl: 6 .  aspirin EC 81 MG tablet, Take 81 mg by mouth daily., Disp: , Rfl:  .  budesonide-formoterol (SYMBICORT) 160-4.5 MCG/ACT inhaler, Inhale 2 puffs into the lungs 2 (two) times daily., Disp: , Rfl:  .  cabergoline (DOSTINEX) 0.5 MG tablet, , Disp: , Rfl:  .  chlorthalidone (HYGROTON) 25 MG tablet, Take 25 mg by mouth daily., Disp: , Rfl:  .  dexlansoprazole (DEXILANT) 60 MG capsule, Take 60 mg by mouth daily.,  Disp: , Rfl:  .  levothyroxine (SYNTHROID, LEVOTHROID) 88 MCG tablet, Take 88 mcg by mouth daily before breakfast. , Disp: , Rfl:  .  metoprolol succinate (TOPROL-XL) 25 MG 24 hr tablet, Take 25 mg by mouth daily., Disp: , Rfl:  .  Omega-3 Fatty Acids (FISH OIL) 1000 MG CAPS, Take 1 capsule by mouth daily., Disp: , Rfl:  .  rosuvastatin (CRESTOR) 10 MG tablet, Take 10 mg by mouth daily., Disp: , Rfl:  .  spironolactone (ALDACTONE) 25 MG tablet, Take 25 mg by mouth daily., Disp: , Rfl:  .  telmisartan (MICARDIS) 80 MG tablet, Take 80 mg by mouth daily., Disp: , Rfl:  .  testosterone cypionate (DEPOTESTOSTERONE CYPIONATE) 200 MG/ML injection, Inject 1 mL into the muscle every 14 (fourteen) days., Disp: , Rfl:  .  gabapentin (NEURONTIN) 300 MG capsule, Take 1 capsule (300 mg total) by mouth 3 (three) times daily. (Patient not taking: Reported on 01/26/2018), Disp: 60 capsule, Rfl: 2      Objective:   Vitals:   01/26/18 0858  BP: 122/78  Pulse: 76  SpO2: 100%  Weight: 270 lb 6.4 oz (122.7 kg)  Height: 6\' 1"  (1.854 m)    Estimated body mass index is 35.67 kg/m as calculated from the following:   Height as of this encounter: 6\' 1"  (1.854 m).   Weight as of this encounter: 270 lb 6.4 oz (122.7 kg).  @WEIGHTCHANGE @  [employer]   01/26/18 0858  Weight: 270 lb 6.4 oz (122.7 kg)     Physical Exam  General Appearance:    Alert, cooperative, no distress, appears stated age - yes , Deconditioned looking - no , OBESE  - no, Sitting on Wheelchair -  no  Head:    Normocephalic, without obvious abnormality, atraumatic  Eyes:    PERRL, conjunctiva/corneas clear,  Ears:    Normal TM's and external ear canals, both ears  Nose:   Nares normal, septum midline, mucosa normal, no drainage    or sinus tenderness. OXYGEN ON  - no . Patient is @ ra   Throat:   Lips, mucosa, and tongue normal; teeth and gums normal. Cyanosis on lips - no  Neck:   Supple, symmetrical, trachea midline, no adenopathy;      thyroid:  no enlargement/tenderness/nodules; no carotid   bruit or JVD  Back:     Symmetric, no curvature, ROM normal, no CVA tenderness  Lungs:     Distress - no , Wheeze no, Barrell Chest - no, Purse lip breathing - no, Crackles - n. HOARSE LARYNGEAL COUGH   Chest Wall:    No tenderness or deformity.    Heart:    Regular rate and rhythm, S1 and S2 normal, no rub   or gallop, Murmur - no  Breast Exam:    NOT DONE  Abdomen:     Soft, non-tender, bowel sounds active all four quadrants,    no masses, no organomegaly. Visceral obesity - no  Genitalia:   NOT DONE  Rectal:   NOT DONE  Extremities:   Extremities - normal, Has Cane - no, Clubbing - no, Edema - no  Pulses:   2+ and symmetric all extremities  Skin:   Stigmata of Connective Tissue Disease - no  Lymph nodes:   Cervical, supraclavicular, and axillary nodes normal  Psychiatric:  Neurologic:   Pleasant - yes, Anxious - no, Flat affect - no  CAm-ICU - neg, Alert and Oriented x 3 - yes, Moves all 4s - yes, Speech - normal, Cognition - intact           Assessment:       ICD-10-CM   1. Acute bronchitis, unspecified organism J20.9   2. 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 Acute bronchitis, unspecified organism J20.9   2. 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 Brand Males,  M.D., F.C.C.P,  Pulmonary and Critical Care Medicine Staff Physician, [place] Director - Interstitial Lung Disease  Program  [facility] [place] [facility] [place], [place], [address]  Pager: [phone], If no answer or between  15:00h - 7:00h: call [phone] Telephone: [phone]  9:34 AM 01/26/2018

## 2018-04-22 ENCOUNTER — Other Ambulatory Visit: Payer: Self-pay | Admitting: Internal Medicine

## 2018-05-18 ENCOUNTER — Other Ambulatory Visit: Payer: Self-pay | Admitting: Internal Medicine

## 2018-06-25 DIAGNOSIS — N529 Male erectile dysfunction, unspecified: Secondary | ICD-10-CM

## 2018-06-25 HISTORY — DX: Male erectile dysfunction, unspecified: N52.9

## 2019-04-17 ENCOUNTER — Other Ambulatory Visit: Payer: Self-pay | Admitting: Internal Medicine

## 2019-05-24 ENCOUNTER — Telehealth: Payer: Self-pay | Admitting: Internal Medicine

## 2019-05-24 NOTE — Telephone Encounter (Signed)
Last seen Jan 2020- will need appt for medication   LMTCB x 1

## 2019-05-25 NOTE — Telephone Encounter (Signed)
lmtcb x2 

## 2019-05-26 NOTE — Telephone Encounter (Signed)
LMTCB x3 for pt. We have attempted to contact pt several times with no success or call back from pt. Per triage protocol, message will be closed.   

## 2020-02-03 ENCOUNTER — Other Ambulatory Visit: Payer: Self-pay | Admitting: Family Medicine

## 2020-02-03 ENCOUNTER — Ambulatory Visit
Admission: RE | Admit: 2020-02-03 | Discharge: 2020-02-03 | Disposition: A | Discharge status: Home / Self Care | Payer: Commercial Managed Care - PPO | Source: Ambulatory Visit | Attending: Family Medicine | Admitting: Family Medicine

## 2020-02-03 DIAGNOSIS — R042 Hemoptysis: Secondary | ICD-10-CM

## 2020-02-03 MED ORDER — IOPAMIDOL (ISOVUE-300) INJECTION 61%
75.0000 mL | Freq: Once | INTRAVENOUS | Status: AC | PRN
  Administered 2020-02-03: 75 mL via INTRAVENOUS

## 2020-02-15 DIAGNOSIS — R0602 Shortness of breath: Secondary | ICD-10-CM | POA: Diagnosis not present

## 2020-03-06 ENCOUNTER — Encounter: Payer: Self-pay | Admitting: *Deleted

## 2020-03-06 ENCOUNTER — Encounter: Payer: Self-pay | Admitting: Cardiology

## 2020-03-07 DIAGNOSIS — M5030 Other cervical disc degeneration, unspecified cervical region: Secondary | ICD-10-CM | POA: Insufficient documentation

## 2020-03-07 DIAGNOSIS — R042 Hemoptysis: Secondary | ICD-10-CM | POA: Insufficient documentation

## 2020-03-07 DIAGNOSIS — D352 Benign neoplasm of pituitary gland: Secondary | ICD-10-CM | POA: Insufficient documentation

## 2020-03-07 DIAGNOSIS — I10 Essential (primary) hypertension: Secondary | ICD-10-CM | POA: Insufficient documentation

## 2020-03-07 DIAGNOSIS — M5412 Radiculopathy, cervical region: Secondary | ICD-10-CM | POA: Insufficient documentation

## 2020-03-08 ENCOUNTER — Encounter: Payer: Self-pay | Admitting: Cardiology

## 2020-03-08 ENCOUNTER — Other Ambulatory Visit: Payer: Self-pay

## 2020-03-08 ENCOUNTER — Ambulatory Visit (INDEPENDENT_AMBULATORY_CARE_PROVIDER_SITE_OTHER): Payer: Commercial Managed Care - PPO | Admitting: Cardiology

## 2020-03-08 VITALS — BP 140/80 | HR 63 | Ht 73.0 in | Wt 265.0 lb

## 2020-03-08 DIAGNOSIS — I259 Chronic ischemic heart disease, unspecified: Secondary | ICD-10-CM

## 2020-03-08 DIAGNOSIS — E782 Mixed hyperlipidemia: Secondary | ICD-10-CM

## 2020-03-08 DIAGNOSIS — I209 Angina pectoris, unspecified: Secondary | ICD-10-CM | POA: Diagnosis not present

## 2020-03-08 DIAGNOSIS — E669 Obesity, unspecified: Secondary | ICD-10-CM

## 2020-03-08 DIAGNOSIS — E039 Hypothyroidism, unspecified: Secondary | ICD-10-CM

## 2020-03-08 DIAGNOSIS — I10 Essential (primary) hypertension: Secondary | ICD-10-CM

## 2020-03-08 DIAGNOSIS — E66811 Obesity, class 1: Secondary | ICD-10-CM

## 2020-03-08 HISTORY — DX: Obesity, class 1: E66.811

## 2020-03-08 HISTORY — DX: Angina pectoris, unspecified: I20.9

## 2020-03-08 HISTORY — DX: Mixed hyperlipidemia: E78.2

## 2020-03-08 HISTORY — DX: Obesity, unspecified: E66.9

## 2020-03-08 MED ORDER — NITROGLYCERIN 0.4 MG SL SUBL
0.4000 mg | SUBLINGUAL_TABLET | SUBLINGUAL | 6 refills | Status: DC | PRN
Start: 2020-03-08 — End: 2020-04-11

## 2020-03-08 NOTE — Patient Instructions (Signed)
Medication Instructions:  No medication changes. *If you need a refill on your cardiac medications before your next appointment, please call your pharmacy*   Lab Work: Your physician recommends that you return for lab work in: within a week of your CT scan. You need to have labs done when you are fasting.  You can come Monday through Friday 8:30 am to 12:00 pm and 1:15 to 4:30. You do not need to make an appointment as the order has already been placed. The labs you are going to have done are BMET, CBC, TSH, LFT and Lipids.  If you have labs (blood work) drawn today and your tests are completely normal, you will receive your results only by: Marland Kitchen MyChart Message (if you have MyChart) OR . A paper copy in the mail If you have any lab test that is abnormal or we need to change your treatment, we will call you to review the results.   Testing/Procedures: Your cardiac CT will be scheduled at:   [facility] [place], [place] [address] [phone]   If scheduled at [facility], please arrive at the [facility] main entrance of [facility] 30 minutes prior to test start time. Proceed to the [facility] Radiology Department (first floor) to check-in and test prep.  Please follow these instructions carefully (unless otherwise directed):  Hold all erectile dysfunction medications at least 3 days (72 hrs) prior to test.  On the Night Before the Test: . Be sure to Drink plenty of water. . Do not consume any caffeinated/decaffeinated beverages or chocolate 12 hours prior to your test. . Do not take any antihistamines 12 hours prior to your test.   On the Day of the Test: . Drink plenty of water. Do not drink any water within one hour of the test. . Do not eat any food 4 hours prior to the test. . You may take your regular medications prior to the test.  . Take metoprolol (Toprol XL) two hours prior to test.       After the Test: . Drink plenty  of water. . After receiving IV contrast, you may experience a mild flushed feeling. This is normal. . On occasion, you may experience a mild rash up to 24 hours after the test. This is not dangerous. If this occurs, you can take Benadryl 25 mg and increase your fluid intake. . If you experience trouble breathing, this can be serious. If it is severe call 911 IMMEDIATELY. If it is mild, please call our office. . If you take any of these medications: Glipizide/Metformin, Avandament, Glucavance, please do not take 48 hours after completing test unless otherwise instructed.   Once we have confirmed authorization from your insurance company, we will call you to set up a date and time for your test. Based on how quickly your insurance processes prior authorizations requests, please allow up to 4 weeks to be contacted for scheduling your Cardiac CT appointment. Be advised that routine Cardiac CT appointments could be scheduled as many as 8 weeks after your provider has ordered it.  For non-scheduling related questions, please contact the cardiac imaging nurse navigator should you have any questions/concerns: Marchia Bond, Cardiac Imaging Nurse Navigator Burley Saver, Interim Cardiac Imaging Nurse [place] [facility] Direct Office Dial: [phone]   For scheduling needs, including cancellations and rescheduling, please call Vivien Rota at [phone].     Follow-Up: At [facility], you and your health needs  are our priority.  As part of our continuing mission to provide you with exceptional heart care, we have created designated Provider Care Teams.  These Care Teams include your primary Cardiologist (physician) and Advanced Practice Providers (APPs -  Physician Assistants and Nurse Practitioners) who all work together to provide you with the care you need, when you need it.  We recommend signing up for the patient portal called "MyChart".  Sign up information is provided on  this After Visit Summary.  MyChart is used to connect with patients for Virtual Visits (Telemedicine).  Patients are able to view lab/test results, encounter notes, upcoming appointments, etc.  Non-urgent messages can be sent to your provider as well.   To learn more about what you can do with MyChart, go to [email or web address].    Your next appointment:   2 month(s)  The format for your next appointment:   In Person  Provider:   Jyl Heinz, MD   Other Instructions Cardiac CT Angiogram A cardiac CT angiogram is a procedure to look at the heart and the area around the heart. It may be done to help find the cause of chest pains or other symptoms of heart disease. During this procedure, a substance called contrast dye is injected into the blood vessels in the area to be checked. A large X-ray machine, called a CT scanner, then takes detailed pictures of the heart and the surrounding area. The procedure is also sometimes called a coronary CT angiogram, coronary artery scanning, or CTA. A cardiac CT angiogram allows the health care provider to see how well blood is flowing to and from the heart. The health care provider will be able to see if there are any problems, such as:  Blockage or narrowing of the coronary arteries in the heart.  Fluid around the heart.  Signs of weakness or disease in the muscles, valves, and tissues of the heart. Tell a health care provider about:  Any allergies you have. This is especially important if you have had a previous allergic reaction to contrast dye.  All medicines you are taking, including vitamins, herbs, eye drops, creams, and over-the-counter medicines.  Any blood disorders you have.  Any surgeries you have had.  Any medical conditions you have.  Whether you are pregnant or may be pregnant.  Any anxiety disorders, chronic pain, or other conditions you have that may increase your stress or prevent you from lying still. What are the  risks? Generally, this is a safe procedure. However, problems may occur, including: 1. Bleeding. 2. Infection. 3. Allergic reactions to medicines or dyes. 4. Damage to other structures or organs. 5. Kidney damage from the contrast dye that is used. 6. Increased risk of cancer from radiation exposure. This risk is low. Talk with your health care provider about: ? The risks and benefits of testing. ? How you can receive the lowest dose of radiation. What happens before the procedure? 1. Wear comfortable clothing and remove any jewelry, glasses, dentures, and hearing aids. 2. Follow instructions from your health care provider about eating and drinking. This may include: ? For 12 hours before the procedure -- avoid caffeine. This includes tea, coffee, soda, energy drinks, and diet pills. Drink plenty of water or other fluids that do not have caffeine in them. Being well hydrated can prevent complications. ? For 4-6 hours before the procedure -- stop eating and drinking. The contrast dye can cause nausea, but this is less likely if your stomach is empty. 3.  Ask your health care provider about changing or stopping your regular medicines. This is especially important if you are taking diabetes medicines, blood thinners, or medicines to treat problems with erections (erectile dysfunction). What happens during the procedure?  1. Hair on your chest may need to be removed so that small sticky patches called electrodes can be placed on your chest. These will transmit information that helps to monitor your heart during the procedure. 2. An IV will be inserted into one of your veins. 3. You might be given a medicine to control your heart rate during the procedure. This will help to ensure that good images are obtained. 4. You will be asked to lie on an exam table. This table will slide in and out of the CT machine during the procedure. 5. Contrast dye will be injected into the IV. You might feel warm, or you  may get a metallic taste in your mouth. 6. You will be given a medicine called nitroglycerin. This will relax or dilate the arteries in your heart. 7. The table that you are lying on will move into the CT machine tunnel for the scan. 8. The person running the machine will give you instructions while the scans are being done. You may be asked to: ? Keep your arms above your head. ? Hold your breath. ? Stay very still, even if the table is moving. 9. When the scanning is complete, you will be moved out of the machine. 10. The IV will be removed. The procedure may vary among health care providers and hospitals. What can I expect after the procedure? After your procedure, it is common to have:  A metallic taste in your mouth from the contrast dye.  A feeling of warmth.  A headache from the nitroglycerin. Follow these instructions at home:  Take over-the-counter and prescription medicines only as told by your health care provider.  If you are told, drink enough fluid to keep your urine pale yellow. This will help to flush the contrast dye out of your body.  Most people can return to their normal activities right after the procedure. Ask your health care provider what activities are safe for you.  It is up to you to get the results of your procedure. Ask your health care provider, or the department that is doing the procedure, when your results will be ready.  Keep all follow-up visits as told by your health care provider. This is important. Contact a health care provider if: 1. You have any symptoms of allergy to the contrast dye. These include: ? Shortness of breath. ? Rash or hives. ? A racing heartbeat. Summary  A cardiac CT angiogram is a procedure to look at the heart and the area around the heart. It may be done to help find the cause of chest pains or other symptoms of heart disease.  During this procedure, a large X-ray machine, called a CT scanner, takes detailed pictures of  the heart and the surrounding area after a contrast dye has been injected into blood vessels in the area.  Ask your health care provider about changing or stopping your regular medicines before the procedure. This is especially important if you are taking diabetes medicines, blood thinners, or medicines to treat erectile dysfunction.  If you are told, drink enough fluid to keep your urine pale yellow. This will help to flush the contrast dye out of your body. This information is not intended to replace advice given to you by your  health care provider. Make sure you discuss any questions you have with your health care provider. Document Revised: 08/18/2018 Document Reviewed: 08/18/2018 Elsevier Patient Education  [employer].

## 2020-03-08 NOTE — Addendum Note (Signed)
Addended by: Jerl Santos R on: 03/08/2020 03:10 PM   Modules accepted: Orders

## 2020-03-08 NOTE — Progress Notes (Signed)
Cardiology Office Note:    Date:  03/08/2020   ID:  Adam [name], DOB [birth year], MRN [id number]  PCP:  Ronita Hipps, MD  Cardiologist:  Jenean Lindau, MD   Referring MD: Ronita Hipps, MD    ASSESSMENT:    1. Primary hypertension   2. Angina pectoris ([place])   3. Mixed dyslipidemia   4. Obesity (BMI 30.0-34.9)    PLAN:    In order of problems listed above:  1. Angina pectoris: Coronary artery disease: I reviewed records from [facility] and primary care physician extensive length.  His symptoms are concerning.  Patient symptoms are concerning in view of risk factors.  He leads a sedentary lifestyle.  Following recommendations were made to him.  Sublingual nitroglycerin prescription was sent, its protocol and 911 protocol explained and the patient vocalized understanding questions were answered to the patient's satisfaction.  He will continue to take a coated baby aspirin on a daily basis.  I mentioned to him invasive and noninvasive coronary angiography.  He prefers CT coronary angiography with FFR and we will set this up for it. 2. Essential hypertension: Blood pressure stable and diet was emphasized.  Lifestyle modification and diet and salt intake issues were discussed. 3. Mixed dyslipidemia: Diet was emphasized.  Lipids were reviewed. 4. Obesity: Weight reduction was stressed.  Risks of obesity explained.  I told him to be active.  Patient is advised not to push himself daily CT test reports come back and then afterwards he can have a graded exercise program. 5. Patient will be seen in follow-up appointment in 2 months or earlier if the patient has any concerns    Medication Adjustments/Labs and Tests Ordered: Current medicines are reviewed at length with the patient today.  Concerns regarding medicines are outlined above.  No orders of the defined types were placed in this encounter.  No orders of the defined types were placed in this encounter.    History of  Present Illness:    Adam [name] is a 65 y.o. male who is being seen today for the evaluation of chest pain at the request of Ronita Hipps, MD.  Patient is a pleasant 65 year old male.  He is accompanied by his wife.  He has history of hypertension, dyslipidemia and coronary artery disease by coronary angiography in 2007.  I do not have that report.  He mentions to me that he was told to have coronary calcifications and 30% blockages at that time.  I have a report of the coronary calcifications on CT scan done in 2019.  Patient recently was in the hospital for this stress test and this did not reveal any evidence of ischemia.  However patient leads a sedentary lifestyle.  He mentions to me that he has chest tightness at times and does go to the neck into the arms.  He is concerned about it.  Therefore he is for evaluation.  Again he leads a sedentary lifestyle.  He is retired.  He is overweight.  At the time of my evaluation, the patient is alert awake oriented and in no distress.  Past Medical History:  Diagnosis Date  . Abnormal glucose 07/20/2015  . Acquired hypothyroidism 07/20/2015  . Cervical radiculopathy   . DDD (degenerative disc disease), cervical   . Erectile dysfunction 06/25/2018  . Gynecomastia, male 11/23/2017  . Hemoptysis   . Hyperprolactinemia ([place]) 07/20/2015  . Hypertension   . Hypogonadism in male 07/20/2015  . 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  [place])   . Reflux     Past Surgical History:  Procedure Laterality Date  . CATARACT EXTRACTION    . RIGHT HEART CATH  2006  . ROTATOR CUFF REPAIR Right 04/2016    Current Medications: Current Meds  Medication Sig  . Ascorbic Acid (VITAMIN C) 500 MG CHEW Chew 1,500 mg by mouth daily.  Marland Kitchen aspirin EC 81 MG tablet Take 81 mg by mouth daily.  . budesonide-formoterol (SYMBICORT) 160-4.5 MCG/ACT inhaler Inhale 2 puffs into the lungs 2 (two) times daily as needed ([place] and shortness of breath).  . cabergoline  (DOSTINEX) 0.5 MG tablet Take 0.5 tablets by mouth 2 (two) times a week.  . Cholecalciferol (VITAMIN D3) 1.25 MG (50000 UT) CAPS Take 1 capsule by mouth daily.  . Cyanocobalamin (B12 FAST DISSOLVE PO) Take 5,000 mcg by mouth daily.  Marland Kitchen dexlansoprazole (DEXILANT) 60 MG capsule Take 60 mg by mouth daily.  . fluticasone (FLONASE) 50 MCG/ACT nasal spray Place 2 sprays into both nostrils daily.  Marland Kitchen gabapentin (NEURONTIN) 300 MG capsule Take 300 mg by mouth 2 (two) times daily.  Marland Kitchen levothyroxine (SYNTHROID) 25 MCG tablet Take 25 mcg by mouth daily before breakfast.  . loratadine (CLARITIN) 10 MG tablet Take 10 mg by mouth daily.  . meloxicam (MOBIC) 15 MG tablet Take 15 mg by mouth daily.  . metoprolol succinate (TOPROL-XL) 25 MG 24 hr tablet Take 25 mg by mouth daily.  . Omega-3 Fatty Acids (FISH OIL) 1000 MG CAPS Take 1 capsule by mouth daily.  Marland Kitchen QUERCETIN PO Take 500 mg by mouth daily.  . rosuvastatin (CRESTOR) 10 MG tablet Take 10 mg by mouth at bedtime.  Marland Kitchen spironolactone (ALDACTONE) 25 MG tablet Take 25 mg by mouth daily.  . tadalafil (CIALIS) 20 MG tablet Take 20 mg by mouth daily as needed for erectile dysfunction.  Marland Kitchen telmisartan (MICARDIS) 80 MG tablet Take 80 mg by mouth daily.  Marland Kitchen testosterone cypionate (DEPOTESTOSTERONE CYPIONATE) 200 MG/ML injection Inject 1 mL into the muscle every 14 (fourteen) days.  . Zinc 100 MG TABS Take 1 tablet by mouth daily.  . [DISCONTINUED] gabapentin (NEURONTIN) 300 MG capsule TAKE 1 CAPSULE BY MOUTH THREE TIMES A DAY     Allergies:   Patient has no known allergies.   Social History   Socioeconomic History  . Marital status: Married    Spouse name: Not on file  . Number of children: Not on file  . Years of education: Not on file  . Highest education level: Not on file  Occupational History  . Not on file  Tobacco Use  . Smoking status: Never Smoker  . Smokeless tobacco: Never Used  Substance and Sexual Activity  . Alcohol use: Never  . Drug use:  Never  . Sexual activity: Not on file  Other Topics Concern  . Not on file  Social History Narrative  . Not on file   Social Determinants of Health   Financial Resource Strain: Not on file  Food Insecurity: Not on file  Transportation Needs: Not on file  Physical Activity: Not on file  Stress: Not on file  Social Connections: Not on file     Family History: The patient's family history includes Heart disease in his maternal grandfather; Hypertension in his brother, father, mother, and sister; Ovarian cancer in his maternal grandmother and mother; Pancreatic cancer in his father.  ROS:   Please see the history of present illness.    All other systems reviewed and are  negative.  EKGs/Labs/Other Studies Reviewed:    The following studies were reviewed today: EKG reveals sinus rhythm and nonspecific ST-T changes.    Recent Labs: No results found for requested labs within last 8760 hours.  Recent Lipid Panel No results found for: CHOL, TRIG, HDL, CHOLHDL, VLDL, LDLCALC, LDLDIRECT  Physical Exam:    VS:  BP 140/80 (BP Location: Left Arm, Patient Position: Sitting, Cuff Size: Normal)   Pulse 63   Ht 6\' 1"  (1.854 m)   Wt 265 lb (120.2 kg)   SpO2 94%   BMI 34.96 kg/m     Wt Readings from Last 3 Encounters:  03/08/20 265 lb (120.2 kg)  01/26/18 270 lb 6.4 oz (122.7 kg)  10/23/17 267 lb 6.4 oz (121.3 kg)     GEN: Patient is in no acute distress HEENT: Normal NECK: No JVD; No carotid bruits LYMPHATICS: No lymphadenopathy CARDIAC: S1 S2 regular, 2/6 systolic murmur at the apex. RESPIRATORY:  Clear to auscultation without rales, wheezing or rhonchi  ABDOMEN: Soft, non-tender, non-distended MUSCULOSKELETAL:  No edema; No deformity  SKIN: Warm and dry NEUROLOGIC:  Alert and oriented x 3 PSYCHIATRIC:  Normal affect    Signed, Jenean Lindau, MD  03/08/2020 2:50 PM     [facility]

## 2020-03-17 LAB — HEPATIC FUNCTION PANEL
ALT: 20 IU/L (ref 0–44)
AST: 26 IU/L (ref 0–40)
Albumin: 4.7 g/dL (ref 3.8–4.8)
Alkaline Phosphatase: 90 IU/L (ref 44–121)
Bilirubin Total: 0.4 mg/dL (ref 0.0–1.2)
Bilirubin, Direct: 0.13 mg/dL (ref 0.00–0.40)
Total Protein: 7.3 g/dL (ref 6.0–8.5)

## 2020-03-17 LAB — BASIC METABOLIC PANEL
BUN/Creatinine Ratio: 19 (ref 10–24)
BUN: 27 mg/dL (ref 8–27)
CO2: 21 mmol/L (ref 20–29)
Calcium: 9.3 mg/dL (ref 8.6–10.2)
Chloride: 102 mmol/L (ref 96–106)
Creatinine, Ser: 1.41 mg/dL — ABNORMAL HIGH (ref 0.76–1.27)
Glucose: 115 mg/dL — ABNORMAL HIGH (ref 65–99)
Potassium: 4.5 mmol/L (ref 3.5–5.2)
Sodium: 139 mmol/L (ref 134–144)
eGFR: 56 mL/min/{1.73_m2} — ABNORMAL LOW (ref >59)

## 2020-03-17 LAB — CBC WITH DIFFERENTIAL/PLATELET
Basophils Absolute: 0 10*3/uL (ref 0.0–0.2)
Basos: 1 %
EOS (ABSOLUTE): 0.1 10*3/uL (ref 0.0–0.4)
Eos: 1 %
Hematocrit: 47.3 % (ref 37.5–51.0)
Hemoglobin: 16.1 g/dL (ref 13.0–17.7)
Immature Grans (Abs): 0 10*3/uL (ref 0.0–0.1)
Immature Granulocytes: 1 %
Lymphocytes Absolute: 1.8 10*3/uL (ref 0.7–3.1)
Lymphs: 28 %
MCH: 31.2 pg (ref 26.6–33.0)
MCHC: 34 g/dL (ref 31.5–35.7)
MCV: 92 fL (ref 79–97)
Monocytes Absolute: 0.4 10*3/uL (ref 0.1–0.9)
Monocytes: 7 %
Neutrophils Absolute: 4 10*3/uL (ref 1.4–7.0)
Neutrophils: 62 %
Platelets: 157 10*3/uL (ref 150–450)
RBC: 5.16 x10E6/uL (ref 4.14–5.80)
RDW: 12.5 % (ref 11.6–15.4)
WBC: 6.4 10*3/uL (ref 3.4–10.8)

## 2020-03-17 LAB — LIPID PANEL
Chol/HDL Ratio: 3.2 ratio (ref 0.0–5.0)
Cholesterol, Total: 129 mg/dL (ref 100–199)
HDL: 40 mg/dL (ref >39)
LDL Chol Calc (NIH): 70 mg/dL (ref 0–99)
Triglycerides: 104 mg/dL (ref 0–149)
VLDL Cholesterol Cal: 19 mg/dL (ref 5–40)

## 2020-03-17 LAB — TSH: TSH: 1.09 u[IU]/mL (ref 0.450–4.500)

## 2020-03-22 ENCOUNTER — Telehealth (HOSPITAL_COMMUNITY): Payer: Self-pay | Admitting: *Deleted

## 2020-03-22 NOTE — Telephone Encounter (Signed)
Reaching out to patient to offer assistance regarding upcoming cardiac imaging study; pt verbalizes understanding of appt date/time, parking situation and where to check in, pre-test NPO status and medications ordered, and verified current allergies; name and call back number provided for further questions should they arise  Brenda Samano RN Navigator Cardiac Imaging [facility] [phone] office [phone] cell  

## 2020-03-23 ENCOUNTER — Other Ambulatory Visit: Payer: Self-pay

## 2020-03-23 ENCOUNTER — Ambulatory Visit (HOSPITAL_COMMUNITY)
Admission: RE | Admit: 2020-03-23 | Discharge: 2020-03-23 | Disposition: A | Discharge status: Home / Self Care | Payer: Commercial Managed Care - PPO | Source: Ambulatory Visit | Attending: Cardiology | Admitting: Cardiology

## 2020-03-23 DIAGNOSIS — I10 Essential (primary) hypertension: Secondary | ICD-10-CM

## 2020-03-23 DIAGNOSIS — I209 Angina pectoris, unspecified: Secondary | ICD-10-CM | POA: Diagnosis present

## 2020-03-23 DIAGNOSIS — I259 Chronic ischemic heart disease, unspecified: Secondary | ICD-10-CM

## 2020-03-23 DIAGNOSIS — I2510 Atherosclerotic heart disease of native coronary artery without angina pectoris: Secondary | ICD-10-CM | POA: Diagnosis not present

## 2020-03-23 MED ORDER — IOHEXOL 350 MG/ML SOLN
80.0000 mL | Freq: Once | INTRAVENOUS | Status: AC | PRN
  Administered 2020-03-23: 80 mL via INTRAVENOUS

## 2020-03-23 MED ORDER — NITROGLYCERIN 0.4 MG SL SUBL
0.8000 mg | SUBLINGUAL_TABLET | Freq: Once | SUBLINGUAL | Status: AC
  Administered 2020-03-23: 0.8 mg via SUBLINGUAL

## 2020-03-23 MED ORDER — NITROGLYCERIN 0.4 MG SL SUBL
SUBLINGUAL_TABLET | SUBLINGUAL | Status: AC
  Filled 2020-03-23: qty 2

## 2020-03-26 DIAGNOSIS — I2510 Atherosclerotic heart disease of native coronary artery without angina pectoris: Secondary | ICD-10-CM | POA: Diagnosis not present

## 2020-04-02 ENCOUNTER — Ambulatory Visit: Payer: Commercial Managed Care - PPO | Admitting: Cardiology

## 2020-04-11 ENCOUNTER — Other Ambulatory Visit: Payer: Self-pay

## 2020-04-11 DIAGNOSIS — K219 Gastro-esophageal reflux disease without esophagitis: Secondary | ICD-10-CM | POA: Insufficient documentation

## 2020-04-12 ENCOUNTER — Encounter: Payer: Self-pay | Admitting: Cardiology

## 2020-04-12 ENCOUNTER — Other Ambulatory Visit: Payer: Self-pay

## 2020-04-12 ENCOUNTER — Ambulatory Visit (INDEPENDENT_AMBULATORY_CARE_PROVIDER_SITE_OTHER): Payer: Commercial Managed Care - PPO | Admitting: Cardiology

## 2020-04-12 VITALS — BP 142/72 | HR 62 | Ht 73.0 in | Wt 263.2 lb

## 2020-04-12 DIAGNOSIS — I10 Essential (primary) hypertension: Secondary | ICD-10-CM

## 2020-04-12 DIAGNOSIS — E782 Mixed hyperlipidemia: Secondary | ICD-10-CM

## 2020-04-12 DIAGNOSIS — E039 Hypothyroidism, unspecified: Secondary | ICD-10-CM

## 2020-04-12 DIAGNOSIS — I2510 Atherosclerotic heart disease of native coronary artery without angina pectoris: Secondary | ICD-10-CM | POA: Insufficient documentation

## 2020-04-12 DIAGNOSIS — E669 Obesity, unspecified: Secondary | ICD-10-CM

## 2020-04-12 HISTORY — DX: Atherosclerotic heart disease of native coronary artery without angina pectoris: I25.10

## 2020-04-12 NOTE — Progress Notes (Signed)
Cardiology Office Note:    Date:  04/12/2020   ID:  Adam [name], DOB [birth year], MRN [id number]  PCP:  Adam Hipps, MD  Cardiologist:  Adam Lindau, MD   Referring MD: Adam Hipps, MD    ASSESSMENT:    1. Primary hypertension   2. Mixed dyslipidemia   3. Acquired hypothyroidism   4. Coronary artery disease involving native coronary artery of native heart without angina pectoris   5. Obesity (BMI 30.0-34.9)    PLAN:    In order of problems listed above:  1. Coronary artery disease: CT coronary angiography report was discussed with the patient at extensive length and he vocalized understanding.  Questions were answered to satisfaction.  Secondary prevention stressed.  I told him to walk at least half an hour a day 5 days a week and he promises to do so. 2. Essential hypertension: Blood pressure stable and lifestyle modification was urged.  His blood pressures at home are fine. 3. Mixed dyslipidemia: Lipids reviewed and they are fine.  He is on statin therapy now.  Diet was emphasized. 4. Obesity: Weight reduction stressed.  Importance of exercise and diet stressed.  Risks of obesity explained and he promises to do better. 5. Patient will be seen in follow-up appointment in 4 months or earlier if the patient has any concerns    Medication Adjustments/Labs and Tests Ordered: Current medicines are reviewed at length with the patient today.  Concerns regarding medicines are outlined above.  Orders Placed This Encounter  Procedures  . Basic metabolic panel  . CBC with Differential/Platelet  . Hepatic function panel  . Lipid panel  . TSH   No orders of the defined types were placed in this encounter.    No chief complaint on file.    History of Present Illness:    Adam [name] is a 65 y.o. male.  Patient has past medical history of coronary artery disease, essential hypertension and dyslipidemia.  His coronary angiography by CT scanning was abnormal and  therefore never started walking on a regular basis.  He denies any chest pain orthopnea or PND.  His wife accompanies him for this visit.  He has done a good job with lifestyle modification.  At the time of my evaluation, the patient is alert awake oriented and in no distress.  Past Medical History:  Diagnosis Date  . Abnormal glucose 07/20/2015  . Acid reflux   . Acquired hypothyroidism 07/20/2015  . Angina pectoris ([place]) 03/08/2020  . Cervical radiculopathy   . DDD (degenerative disc disease), cervical   . Erectile dysfunction 06/25/2018  . Gynecomastia, male 11/23/2017  . Hemoptysis   . 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 Hypertension   . Hypogonadism in male 07/20/2015  . Mixed dyslipidemia 03/08/2020  . Obesity (BMI 30.0-34.9) 03/08/2020  . 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[place])   . Reflux     Past Surgical History:  Procedure Laterality Date  . CATARACT EXTRACTION    . RIGHT HEART CATH  2006  . ROTATOR CUFF REPAIR Right 04/2016    Current Medications: Current Meds  Medication Sig  . Ascorbic Acid (VITAMIN C) 500 MG CHEW Chew 1,500 mg by mouth daily.  Marland Kitchen aspirin EC 81 MG tablet Take 81 mg by mouth daily.  . budesonide-formoterol (SYMBICORT) 160-4.5 MCG/ACT inhaler Inhale 2 puffs into the lungs 2 (two) times daily as needed ([place] and shortness of breath).  . cabergoline (DOSTINEX) 0.5 MG tablet Take 0.5 tablets by mouth 2 (  two) times a week.  . Cholecalciferol (VITAMIN D3) 1.25 MG (50000 UT) CAPS Take 1 capsule by mouth daily.  . Cyanocobalamin (B12 FAST DISSOLVE PO) Take 5,000 mcg by mouth daily.  Marland Kitchen dexlansoprazole (DEXILANT) 60 MG capsule Take 60 mg by mouth daily.  . fluticasone (FLONASE) 50 MCG/ACT nasal spray Place 2 sprays into both nostrils daily.  Marland Kitchen gabapentin (NEURONTIN) 300 MG capsule Take 300 mg by mouth 2 (two) times daily.  Marland Kitchen levothyroxine (SYNTHROID) 88 MCG tablet Take 88 mcg by mouth daily.  Marland Kitchen loratadine (CLARITIN) 10 MG tablet Take 10 mg by mouth  daily.  . meloxicam (MOBIC) 15 MG tablet Take 15 mg by mouth daily.  . metoprolol succinate (TOPROL-XL) 25 MG 24 hr tablet Take 25 mg by mouth daily.  . nitroGLYCERIN (NITROSTAT) 0.4 MG SL tablet Place 0.4 mg under the tongue every 5 (five) minutes as needed for chest pain.  . Omega-3 Fatty Acids (FISH OIL) 1000 MG CAPS Take 1 capsule by mouth daily.  Marland Kitchen QUERCETIN PO Take 500 mg by mouth daily.  . rosuvastatin (CRESTOR) 10 MG tablet Take 10 mg by mouth at bedtime.  Marland Kitchen spironolactone (ALDACTONE) 25 MG tablet Take 25 mg by mouth daily.  . tadalafil (CIALIS) 20 MG tablet Take 20 mg by mouth daily as needed for erectile dysfunction.  Marland Kitchen telmisartan (MICARDIS) 80 MG tablet Take 80 mg by mouth daily.  Marland Kitchen testosterone cypionate (DEPOTESTOSTERONE CYPIONATE) 200 MG/ML injection Inject 0.6 mLs into the muscle every 14 (fourteen) days.  . Zinc 100 MG TABS Take 1 tablet by mouth daily.     Allergies:   Patient has no known allergies.   Social History   Socioeconomic History  . Marital status: Married    Spouse name: Not on file  . Number of children: Not on file  . Years of education: Not on file  . Highest education level: Not on file  Occupational History  . Not on file  Tobacco Use  . Smoking status: Never Smoker  . Smokeless tobacco: Never Used  Substance and Sexual Activity  . Alcohol use: Never  . Drug use: Never  . Sexual activity: Not on file  Other Topics Concern  . Not on file  Social History Narrative  . Not on file   Social Determinants of Health   Financial Resource Strain: Not on file  Food Insecurity: Not on file  Transportation Needs: Not on file  Physical Activity: Not on file  Stress: Not on file  Social Connections: Not on file     Family History: The patient's family history includes Heart disease in his maternal grandfather; Hypertension in his brother, father, mother, and sister; Ovarian cancer in his maternal grandmother and mother; Pancreatic cancer in his  father.  ROS:   Please see the history of present illness.    All other systems reviewed and are negative.  EKGs/Labs/Other Studies Reviewed:    The following studies were reviewed today: FFRCT ANALYSIS  FINDINGS: FFRct analysis was performed on the original cardiac CT angiogram dataset. Diagrammatic representation of the FFRct analysis is provided in a separate PDF document in PACS. This dictation was created using the PDF document and an interactive 3D model of the results. 3D model is not available in the EMR/PACS. Normal FFR range is >0.80.  1. Left Main: 0.95  2. LAD: Proximal 0.92, Mid 0.85, Distal 0.75, distal tip 0.67  3. LCX: Proximal 0.95, Mid 0.95, Distal 0.91  4. RCA: Proximal 0.95, Mid 0.95, Distal  RCA was not analyzed.  IMPRESSION: FFRct reports a flow limiting lesion in the distal LAD. Given this is the distal portion of the vessel, recommend optimization of guideline directed medical therapy to include antianginals prior to cardiac catheterization consideration.  Note: These examples are not recommendations of [employer] and only provided as examples of what other customers are doing.   Electronically Signed   By: Berniece Salines DO   On: 03/26/2020 18:02   Recent Labs: 03/16/2020: ALT 20; BUN 27; Creatinine, Ser 1.41; Hemoglobin 16.1; Platelets 157; Potassium 4.5; Sodium 139; TSH 1.090  Recent Lipid Panel    Component Value Date/Time   CHOL 129 03/16/2020 0837   TRIG 104 03/16/2020 0837   HDL 40 03/16/2020 0837   CHOLHDL 3.2 03/16/2020 0837   LDLCALC 70 03/16/2020 0837    Physical Exam:    VS:  BP (!) 142/72   Pulse 62   Ht 6\' 1"  (1.854 m)   Wt 263 lb 3.2 oz (119.4 kg)   SpO2 94%   BMI 34.73 kg/m     Wt Readings from Last 3 Encounters:  04/12/20 263 lb 3.2 oz (119.4 kg)  03/08/20 265 lb (120.2 kg)  01/26/18 270 lb 6.4 oz (122.7 kg)     GEN: Patient is in no acute distress HEENT: Normal NECK: No JVD; No carotid  bruits LYMPHATICS: No lymphadenopathy CARDIAC: Hear sounds regular, 2/6 systolic murmur at the apex. RESPIRATORY:  Clear to auscultation without rales, wheezing or rhonchi  ABDOMEN: Soft, non-tender, non-distended MUSCULOSKELETAL:  No edema; No deformity  SKIN: Warm and dry NEUROLOGIC:  Alert and oriented x 3 PSYCHIATRIC:  Normal affect   Signed, Adam Lindau, MD  04/12/2020 4:16 PM    [facility]

## 2020-04-12 NOTE — Patient Instructions (Signed)
Medication Instructions:  No medication changes. *If you need a refill on your cardiac medications before your next appointment, please call your pharmacy*   Lab Work: Your physician recommends that you return for lab work in: before your next visit. You need to have labs done when you are fasting.  You can come Monday through Friday 8:30 am to 12:00 pm and 1:15 to 4:30. You do not need to make an appointment as the order has already been placed. The labs you are going to have done are BMET, CBC, TSH, LFT and Lipids. If you have labs (blood work) drawn today and your tests are completely normal, you will receive your results only by: Marland Kitchen MyChart Message (if you have MyChart) OR . A paper copy in the mail If you have any lab test that is abnormal or we need to change your treatment, we will call you to review the results.   Testing/Procedures: None ordered   Follow-Up: At [facility], you and your health needs are our priority.  As part of our continuing mission to provide you with exceptional heart care, we have created designated Provider Care Teams.  These Care Teams include your primary Cardiologist (physician) and Advanced Practice Providers (APPs -  Physician Assistants and Nurse Practitioners) who all work together to provide you with the care you need, when you need it.  We recommend signing up for the patient portal called "MyChart".  Sign up information is provided on this After Visit Summary.  MyChart is used to connect with patients for Virtual Visits (Telemedicine).  Patients are able to view lab/test results, encounter notes, upcoming appointments, etc.  Non-urgent messages can be sent to your provider as well.   To learn more about what you can do with MyChart, go to [email or web address].    Your next appointment:   4 month(s)  The format for your next appointment:   In Person  Provider:   Jyl Heinz, MD   Other Instructions NA

## 2020-05-15 ENCOUNTER — Ambulatory Visit: Payer: Commercial Managed Care - PPO | Admitting: Cardiology

## 2020-05-15 ENCOUNTER — Other Ambulatory Visit: Payer: Self-pay

## 2020-05-15 ENCOUNTER — Ambulatory Visit (HOSPITAL_COMMUNITY)
Admission: AD | Admit: 2020-05-15 | Payer: Commercial Managed Care - PPO | Source: Other Acute Inpatient Hospital | Admitting: Cardiology

## 2020-05-15 ENCOUNTER — Observation Stay (HOSPITAL_COMMUNITY)
Admission: AD | Admit: 2020-05-15 | Discharge: 2020-05-15 | Disposition: A | Discharge status: Home / Self Care | Payer: Commercial Managed Care - PPO | Source: Other Acute Inpatient Hospital | Attending: Cardiology | Admitting: Cardiology

## 2020-05-15 ENCOUNTER — Encounter (HOSPITAL_COMMUNITY): Payer: Self-pay | Admitting: Internal Medicine

## 2020-05-15 ENCOUNTER — Encounter (HOSPITAL_COMMUNITY)
Admission: AD | Disposition: A | Discharge status: Home / Self Care | Payer: Self-pay | Source: Other Acute Inpatient Hospital | Attending: Cardiology

## 2020-05-15 DIAGNOSIS — I25110 Atherosclerotic heart disease of native coronary artery with unstable angina pectoris: Principal | ICD-10-CM | POA: Insufficient documentation

## 2020-05-15 DIAGNOSIS — I25119 Atherosclerotic heart disease of native coronary artery with unspecified angina pectoris: Secondary | ICD-10-CM | POA: Diagnosis not present

## 2020-05-15 DIAGNOSIS — Z79899 Other long term (current) drug therapy: Secondary | ICD-10-CM | POA: Insufficient documentation

## 2020-05-15 DIAGNOSIS — R079 Chest pain, unspecified: Secondary | ICD-10-CM

## 2020-05-15 DIAGNOSIS — I200 Unstable angina: Secondary | ICD-10-CM

## 2020-05-15 DIAGNOSIS — I2510 Atherosclerotic heart disease of native coronary artery without angina pectoris: Secondary | ICD-10-CM

## 2020-05-15 DIAGNOSIS — I10 Essential (primary) hypertension: Secondary | ICD-10-CM

## 2020-05-15 DIAGNOSIS — R931 Abnormal findings on diagnostic imaging of heart and coronary circulation: Secondary | ICD-10-CM | POA: Diagnosis present

## 2020-05-15 DIAGNOSIS — Z20822 Contact with and (suspected) exposure to covid-19: Secondary | ICD-10-CM | POA: Diagnosis not present

## 2020-05-15 DIAGNOSIS — E039 Hypothyroidism, unspecified: Secondary | ICD-10-CM | POA: Insufficient documentation

## 2020-05-15 DIAGNOSIS — Z9889 Other specified postprocedural states: Secondary | ICD-10-CM

## 2020-05-15 DIAGNOSIS — R0789 Other chest pain: Secondary | ICD-10-CM | POA: Diagnosis present

## 2020-05-15 DIAGNOSIS — E782 Mixed hyperlipidemia: Secondary | ICD-10-CM | POA: Diagnosis not present

## 2020-05-15 HISTORY — DX: Chest pain, unspecified: R07.9

## 2020-05-15 HISTORY — PX: LEFT HEART CATH AND CORONARY ANGIOGRAPHY: CATH118249

## 2020-05-15 HISTORY — DX: Other specified postprocedural states: Z98.890

## 2020-05-15 HISTORY — DX: Unstable angina: I20.0

## 2020-05-15 LAB — SARS CORONAVIRUS 2 (TAT 6-24 HRS): SARS Coronavirus 2: NEGATIVE

## 2020-05-15 SURGERY — LEFT HEART CATH AND CORONARY ANGIOGRAPHY
Anesthesia: LOCAL

## 2020-05-15 MED ORDER — ACETAMINOPHEN 325 MG PO TABS: 650.0000 mg | ORAL_TABLET | ORAL | Status: DC | PRN

## 2020-05-15 MED ORDER — SODIUM CHLORIDE 0.9 % IV SOLN: 250.0000 mL | INTRAVENOUS | Status: DC | PRN

## 2020-05-15 MED ORDER — VERAPAMIL HCL 2.5 MG/ML IV SOLN
INTRAVENOUS | Status: AC
  Filled 2020-05-15: qty 2

## 2020-05-15 MED ORDER — ENOXAPARIN SODIUM 40 MG/0.4ML IJ SOSY: 40.0000 mg | PREFILLED_SYRINGE | INTRAMUSCULAR | Status: DC

## 2020-05-15 MED ORDER — IOHEXOL 350 MG/ML SOLN
INTRAVENOUS | Status: DC | PRN
  Administered 2020-05-15: 70 mL

## 2020-05-15 MED ORDER — ALPRAZOLAM 0.25 MG PO TABS: 0.2500 mg | ORAL_TABLET | Freq: Two times a day (BID) | ORAL | Status: DC | PRN

## 2020-05-15 MED ORDER — SODIUM CHLORIDE 0.9 % IV SOLN: INTRAVENOUS | Status: AC

## 2020-05-15 MED ORDER — MOMETASONE FURO-FORMOTEROL FUM 200-5 MCG/ACT IN AERO
2.0000 | INHALATION_SPRAY | Freq: Two times a day (BID) | RESPIRATORY_TRACT | Status: DC
  Filled 2020-05-15: qty 8.8

## 2020-05-15 MED ORDER — MIDAZOLAM HCL 2 MG/2ML IJ SOLN
INTRAMUSCULAR | Status: AC
  Filled 2020-05-15: qty 2

## 2020-05-15 MED ORDER — HEPARIN SODIUM (PORCINE) 1000 UNIT/ML IJ SOLN
INTRAMUSCULAR | Status: AC
  Filled 2020-05-15: qty 1

## 2020-05-15 MED ORDER — SODIUM CHLORIDE 0.9% FLUSH: 3.0000 mL | INTRAVENOUS | Status: DC | PRN

## 2020-05-15 MED ORDER — ZOLPIDEM TARTRATE 5 MG PO TABS: 5.0000 mg | ORAL_TABLET | Freq: Every evening | ORAL | Status: DC | PRN

## 2020-05-15 MED ORDER — FLUTICASONE PROPIONATE 50 MCG/ACT NA SUSP: 2.0000 | Freq: Every day | NASAL | 2 refills | Status: DC

## 2020-05-15 MED ORDER — GABAPENTIN 300 MG PO CAPS
300.0000 mg | ORAL_CAPSULE | Freq: Two times a day (BID) | ORAL | Status: DC
  Administered 2020-05-15: 300 mg via ORAL
  Filled 2020-05-15: qty 1

## 2020-05-15 MED ORDER — HEPARIN (PORCINE) IN NACL 1000-0.9 UT/500ML-% IV SOLN
INTRAVENOUS | Status: AC
  Filled 2020-05-15: qty 1000

## 2020-05-15 MED ORDER — OMEGA-3-ACID ETHYL ESTERS 1 G PO CAPS
1.0000 g | ORAL_CAPSULE | Freq: Every day | ORAL | Status: DC
  Administered 2020-05-15: 1 g via ORAL
  Filled 2020-05-15: qty 1

## 2020-05-15 MED ORDER — MIDAZOLAM HCL 2 MG/2ML IJ SOLN
INTRAMUSCULAR | Status: DC | PRN
  Administered 2020-05-15: 2 mg via INTRAVENOUS

## 2020-05-15 MED ORDER — LIDOCAINE HCL (PF) 1 % IJ SOLN
INTRAMUSCULAR | Status: AC
  Filled 2020-05-15: qty 30

## 2020-05-15 MED ORDER — QUERCETIN 250 MG PO TABS: 500.0000 mg | ORAL_TABLET | Freq: Every day | ORAL | Status: DC

## 2020-05-15 MED ORDER — LEVOTHYROXINE SODIUM 88 MCG PO TABS: 88.0000 ug | ORAL_TABLET | Freq: Every day | ORAL | Status: DC

## 2020-05-15 MED ORDER — SODIUM CHLORIDE 0.9% FLUSH: 3.0000 mL | Freq: Two times a day (BID) | INTRAVENOUS | Status: DC

## 2020-05-15 MED ORDER — VITAMIN C 500 MG PO CHEW: 1500.0000 mg | CHEWABLE_TABLET | Freq: Every day | ORAL | Status: DC

## 2020-05-15 MED ORDER — CABERGOLINE 0.5 MG PO TABS: 0.2500 mg | ORAL_TABLET | ORAL | Status: DC

## 2020-05-15 MED ORDER — LABETALOL HCL 5 MG/ML IV SOLN: 10.0000 mg | INTRAVENOUS | Status: AC | PRN

## 2020-05-15 MED ORDER — VERAPAMIL HCL 2.5 MG/ML IV SOLN
INTRAVENOUS | Status: DC | PRN
  Administered 2020-05-15: 10 mL via INTRA_ARTERIAL

## 2020-05-15 MED ORDER — ZINC 100 MG PO TABS: 1.0000 | ORAL_TABLET | Freq: Every day | ORAL | Status: DC

## 2020-05-15 MED ORDER — HEPARIN (PORCINE) IN NACL 1000-0.9 UT/500ML-% IV SOLN
INTRAVENOUS | Status: DC | PRN
  Administered 2020-05-15 (×2): 500 mL

## 2020-05-15 MED ORDER — SODIUM CHLORIDE 0.9% FLUSH
3.0000 mL | Freq: Two times a day (BID) | INTRAVENOUS | Status: DC
  Administered 2020-05-15: 3 mL via INTRAVENOUS

## 2020-05-15 MED ORDER — HEPARIN SODIUM (PORCINE) 1000 UNIT/ML IJ SOLN
INTRAMUSCULAR | Status: DC | PRN
  Administered 2020-05-15: 6000 [IU] via INTRAVENOUS

## 2020-05-15 MED ORDER — LIDOCAINE HCL (PF) 1 % IJ SOLN
INTRAMUSCULAR | Status: DC | PRN
  Administered 2020-05-15: 5 mL

## 2020-05-15 MED ORDER — SODIUM CHLORIDE 0.9 % WEIGHT BASED INFUSION: 3.0000 mL/kg/h | INTRAVENOUS | Status: DC

## 2020-05-15 MED ORDER — PANTOPRAZOLE SODIUM 40 MG PO TBEC: 40.0000 mg | DELAYED_RELEASE_TABLET | Freq: Every day | ORAL | Status: DC

## 2020-05-15 MED ORDER — LORATADINE 10 MG PO TABS
10.0000 mg | ORAL_TABLET | Freq: Every day | ORAL | Status: DC
  Administered 2020-05-15: 10 mg via ORAL
  Filled 2020-05-15: qty 1

## 2020-05-15 MED ORDER — VITAMIN D3 1.25 MG (50000 UT) PO CAPS: 1.0000 | ORAL_CAPSULE | Freq: Every day | ORAL | Status: DC

## 2020-05-15 MED ORDER — FENTANYL CITRATE (PF) 100 MCG/2ML IJ SOLN
INTRAMUSCULAR | Status: DC | PRN
  Administered 2020-05-15: 25 ug via INTRAVENOUS

## 2020-05-15 MED ORDER — HYDRALAZINE HCL 20 MG/ML IJ SOLN: 10.0000 mg | INTRAMUSCULAR | Status: AC | PRN

## 2020-05-15 MED ORDER — CYANOCOBALAMIN 5000 MCG PO TBDP: 5000.0000 ug | ORAL_TABLET | Freq: Every day | ORAL | Status: DC

## 2020-05-15 MED ORDER — ONDANSETRON HCL 4 MG/2ML IJ SOLN: 4.0000 mg | Freq: Four times a day (QID) | INTRAMUSCULAR | Status: DC | PRN

## 2020-05-15 MED ORDER — SPIRONOLACTONE 25 MG PO TABS: 25.0000 mg | ORAL_TABLET | Freq: Every day | ORAL | Status: DC

## 2020-05-15 MED ORDER — LORATADINE 10 MG PO TABS: 10.0000 mg | ORAL_TABLET | Freq: Every day | ORAL | Status: DC

## 2020-05-15 MED ORDER — METOPROLOL SUCCINATE ER 25 MG PO TB24: 25.0000 mg | ORAL_TABLET | Freq: Every day | ORAL | Status: DC

## 2020-05-15 MED ORDER — SODIUM CHLORIDE 0.9 % WEIGHT BASED INFUSION
1.0000 mL/kg/h | INTRAVENOUS | Status: DC
  Administered 2020-05-15: 1 mL/kg/h via INTRAVENOUS

## 2020-05-15 MED ORDER — NITROGLYCERIN 0.4 MG SL SUBL: 0.4000 mg | SUBLINGUAL_TABLET | SUBLINGUAL | Status: DC | PRN

## 2020-05-15 MED ORDER — FLUTICASONE PROPIONATE 50 MCG/ACT NA SUSP
2.0000 | Freq: Every day | NASAL | Status: DC
  Filled 2020-05-15: qty 16

## 2020-05-15 MED ORDER — ROSUVASTATIN CALCIUM 5 MG PO TABS
10.0000 mg | ORAL_TABLET | Freq: Every day | ORAL | Status: DC
  Administered 2020-05-15: 10 mg via ORAL
  Filled 2020-05-15: qty 2

## 2020-05-15 MED ORDER — ASPIRIN 81 MG PO CHEW: 81.0000 mg | CHEWABLE_TABLET | ORAL | Status: DC

## 2020-05-15 MED ORDER — ASPIRIN EC 81 MG PO TBEC: 81.0000 mg | DELAYED_RELEASE_TABLET | Freq: Every day | ORAL | Status: DC

## 2020-05-15 MED ORDER — FENTANYL CITRATE (PF) 100 MCG/2ML IJ SOLN
INTRAMUSCULAR | Status: AC
  Filled 2020-05-15: qty 2

## 2020-05-15 SURGICAL SUPPLY — 11 items
CATH INFINITI 5FR ANG PIGTAIL (CATHETERS) ×1 IMPLANT
CATH OPTITORQUE TIG 4.0 5F (CATHETERS) ×1 IMPLANT
DEVICE RAD COMP TR BAND LRG (VASCULAR PRODUCTS) ×1 IMPLANT
GLIDESHEATH SLEND SS 6F .021 (SHEATH) ×1 IMPLANT
GUIDEWIRE INQWIRE 1.5J.035X260 (WIRE) IMPLANT
INQWIRE 1.5J .035X260CM (WIRE) ×2
KIT HEART LEFT (KITS) ×2 IMPLANT
PACK CARDIAC CATHETERIZATION (CUSTOM PROCEDURE TRAY) ×2 IMPLANT
SYR MEDRAD MARK 7 150ML (SYRINGE) ×1 IMPLANT
TRANSDUCER W/STOPCOCK (MISCELLANEOUS) ×2 IMPLANT
TUBING CIL FLEX 10 FLL-RA (TUBING) ×2 IMPLANT

## 2020-05-15 NOTE — Interval H&P Note (Signed)
History and Physical Interval Note:  05/15/2020 2:32 PM  Adam [name]  has presented today for surgery, with the diagnosis of chest pain - abnormal coronary cta.  The various methods of treatment have been discussed with the patient and family. After consideration of risks, benefits and other options for treatment, the patient has consented to  Procedure(s): LEFT HEART CATH AND CORONARY ANGIOGRAPHY (N/A)  PERCUTANEOUS CORONARY INTERVENTION  as a surgical intervention.  The patient's history has been reviewed, patient examined, no change in status, stable for surgery.  I have reviewed the patient's chart and labs.  Questions were answered to the patient's satisfaction.    Cath Lab Visit (complete for each Cath Lab visit)  Clinical Evaluation Leading to the Procedure:   ACS: No.  Non-ACS:    Anginal Classification: CCS III  Anti-ischemic medical therapy: Minimal Therapy (1 class of medications)  Non-Invasive Test Results: Equivocal test results -> coronary CTA thought to be nonischemic, but now with persistent nitroglycerin responsive symptoms.  Prior CABG: No previous CABG    Glenetta Hew

## 2020-05-15 NOTE — Discharge Summary (Addendum)
Discharge Summary    Patient ID: Adam [name] MRN: [id number]; DOB: Oct 20, 1955  Admit date: 05/15/2020 Discharge date: 05/15/2020  PCP:  Ronita Hipps, MD   [facility] Providers Cardiologist:  Jenean Lindau, MD        Discharge Diagnoses    Principal Problem:   Chest pain of uncertain etiology Active Problems:   Abnormal cardiac CT angiography   S/P cardiac cath with coronary bridging but very minimal CAD -not cause of chest pain    Diagnostic Studies/Procedures    Cardiac cath 05/15/20  The left ventricular systolic function is normal. The left ventricular ejection fraction is 55-65% by visual estimate.  LV end diastolic pressure is normal.  There is no aortic valve stenosis.  Mostly normal coronaries with a Left Dominant Coronary System  Prox LAD lesion is 5% stenosed. - > This site was considered to be moderate by coronary CTA, but likely because the vessel dives down potentially under the myocardium temporarily.  Mid LAD-1 lesion is 10% stenosed. Mid LAD-2 lesion is 10% stenosed. -2 segments of mild kinking likely the beginning and end of the myocardial segment.   SUMMARY  Angiographically minimal CAD with 2 focal areas of subendocardial bridging in the LAD but no stenoses.  Normal EF with normal EDP.    RECOMMENDATIONS  Consider noncardiac chest pain  For resting chest pain if it is cardiac, could consider vasodilatory agents for possible spasm.   PROIR Cardiac CTA   CARDIAC CT:  03/23/2020 Aorta: Normal size. No calcifications. No dissection.  Aortic Valve: Trileaflet. No calcifications.  Coronary Arteries: Normal coronary origin. Right dominance.  RCA is a large dominant artery that gives rise to PDA and PLVB. There is no plaque in the proximal RCA. Motion artifact in the mid and distal RCA with no clear visibility.  Left main is a large artery that gives rise to LAD and LCX arteries.  LAD is a large vessel. There  is a moderate (50-69%) calcified plaque in the proximal LAD. The mid and distal LAD with no plaque.  LCX is a non-dominant artery that gives rise to one large OM1 branch. There is moderate (50-69%) calcified plaque in the proximal LCX. The mid and distal LCX with no plaques.  Other findings:  Normal pulmonary vein drainage into the left atrium.  Normal left atrial appendage without a thrombus.  Normal size of the pulmonary artery.  IMPRESSION: 1. Coronary calcium score of 149. This was 38 percentile for age and sex matched control.  2. Normal coronary origin with right dominance.  3. Moderate Coronary artery disease. CADRADS 3. This study will be sent for FFRct.  FFRCT ANALYSIS  FINDINGS: FFRct analysis was performed on the original cardiac CT angiogram dataset. Diagrammatic representation of the FFRct analysis is provided in a separate PDF document in PACS. This dictation was created using the PDF document and an interactive 3D model of the results. 3D model is not available in the EMR/PACS. Normal FFR range is >0.80.  1. Left Main: 0.95  2. LAD: Proximal 0.92, Mid 0.85, Distal 0.75, distal tip 0.67  3. LCX: Proximal 0.95, Mid 0.95, Distal 0.91  4. RCA: Proximal 0.95, Mid 0.95, Distal RCA was not analyzed.  IMPRESSION: FFRct reports a flow limiting lesion in the distal LAD. Given this is the distal portion of the vessel, recommend optimization of guideline directed medical therapy to include antianginals prior to cardiac catheterization consideration.  _____________   History of Present Illness  Adam [name] is a 65 y.o. male with HTN, HLD, CAD with medical mgt for distal LAD dx, seen on cardiac CT 03/23/20, hypothyroid, GERD, obesity and was admitted to [facility] 05/14/20 due to chest pain -responsive to nitrated.  troponins were neg.  With known distal LAD disease by Cardiac CTA pt was transferred to [facility] for cardiac cath.  Labs at  [place] were normal. Arrival at [facility] with no chest pai or SOB.   Arrived at [facility] morning of 05/15/20  IV fluids were given because he had CT chest at [facility].  IV fluids given. HR in 50s on arrival Sinus brady, BP stable. Cr 1.20  Hospital Course     Consultants: none   Pt was admitted and seen by Dr. Claiborne Billings and prepared for cardiac cath.   He underwent cath and angiographically minimal CAD with 2 focal areas of subendocardial bridging in the LAD but no stenosis.  Normal Ef and normal EDP.  If pain continues could consider vasodilatory agents for possible spasm.  For now will address as outpt.  If pt does well he will be discharged today.     Did the patient have an acute coronary syndrome (MI, NSTEMI, STEMI, etc) this admission?:  No                               Did the patient have a percutaneous coronary intervention (stent / angioplasty)?:  No.       _____________  Discharge Vitals Blood pressure 139/76, pulse 63, temperature 98.3 F (36.8 C), temperature source Oral, resp. rate 18, height 6\' 1"  (1.854 m), weight 116 kg, SpO2 95 %.  Filed Weights   05/15/20 1102  Weight: 116 kg    Labs & Radiologic Studies    CBC No results for input(s): WBC, NEUTROABS, HGB, HCT, MCV, PLT in the last 72 hours. Basic Metabolic Panel No results for input(s): NA, K, CL, CO2, GLUCOSE, BUN, CREATININE, CALCIUM, MG, PHOS in the last 72 hours. Liver Function Tests No results for input(s): AST, ALT, ALKPHOS, BILITOT, PROT, ALBUMIN in the last 72 hours. No results for input(s): LIPASE, AMYLASE in the last 72 hours. High Sensitivity Troponin:   No results for input(s): TROPONINIHS in the last 720 hours.  BNP Invalid input(s): POCBNP D-Dimer No results for input(s): DDIMER in the last 72 hours. Hemoglobin A1C No results for input(s): HGBA1C in the last 72 hours. Fasting Lipid Panel No results for input(s): CHOL, HDL, LDLCALC, TRIG, CHOLHDL, LDLDIRECT in the last 72 hours. Thyroid  Function Tests No results for input(s): TSH, T4TOTAL, T3FREE, THYROIDAB in the last 72 hours.  Invalid input(s): FREET3 _____________  CARDIAC CATHETERIZATION  Result Date: 05/15/2020  The left ventricular systolic function is normal. The left ventricular ejection fraction is 55-65% by visual estimate.  LV end diastolic pressure is normal.  There is no aortic valve stenosis.  Mostly normal coronaries with a Left Dominant Coronary System  Prox LAD lesion is 5% stenosed. - > This site was considered to be moderate by coronary CTA, but likely because the vessel dives down potentially under the myocardium temporarily.  Mid LAD-1 lesion is 10% stenosed. Mid LAD-2 lesion is 10% stenosed. -2 segments of mild kinking likely the beginning and end of the myocardial segment.  SUMMARY  Angiographically minimal CAD with 2 focal areas of subendocardial bridging in the LAD but no stenoses.  Normal EF with normal EDP. RECOMMENDATIONS  Consider  noncardiac chest pain  For resting chest pain if it is cardiac, could consider vasodilatory agents for possible spasm. Glenetta Hew, MD  Disposition   Pt is being discharged home today in good condition.  Follow-up Plans & Appointments   Call [facility] at [phone] if any bleeding, swelling or drainage at cath site.  May shower, no tub baths for 48 hours for groin sticks. No lifting over 5 pounds for 3 days.  No Driving for 3 days  Heart Healthy Diet  If chest pain continues call the office     Follow-up Information    Revankar, Reita Cliche, MD Follow up.   Specialty: Cardiology Why: his office should call for follow up appt in next 2 weeks, if you do not hear by Thursday call his office.  Contact information: [place] [address] [phone]        Ronita Hipps, MD. Schedule an appointment as soon as possible for a visit.   Specialty: Family Medicine Why: to evaluate other reasons for chest pain  Contact  information: [place] [address] [phone]                Discharge Medications   Allergies as of 05/15/2020   No Known Allergies     Medication List    STOP taking these medications   ibuprofen 200 MG tablet Commonly known as: ADVIL   naproxen sodium 220 MG tablet Commonly known as: ALEVE     TAKE these medications   aspirin EC 81 MG tablet Take 81 mg by mouth daily.   cabergoline 0.5 MG tablet Commonly known as: DOSTINEX Take 0.5 tablets by mouth 2 (two) times a week.   dexlansoprazole 60 MG capsule Commonly known as: DEXILANT Take 60 mg by mouth daily.   EQL Vitamin D3 25 MCG (1000 UT) capsule Generic drug: Cholecalciferol Take 1,000 Units by mouth daily.   Fish Oil 1000 MG Caps Take 1,000 mg by mouth daily.   fluticasone 50 MCG/ACT nasal spray Commonly known as: FLONASE Place 2 sprays into both nostrils daily. Start taking on: May 16, 2020   gabapentin 300 MG capsule Commonly known as: NEURONTIN Take 300 mg by mouth 2 (two) times daily.   levothyroxine 88 MCG tablet Commonly known as: SYNTHROID Take 88 mcg by mouth daily.   loratadine 10 MG tablet Commonly known as: CLARITIN Take 1 tablet (10 mg total) by mouth daily. Start taking on: May 16, 2020   meloxicam 15 MG tablet Commonly known as: MOBIC Take 15 mg by mouth daily.   metoprolol succinate 25 MG 24 hr tablet Commonly known as: TOPROL-XL Take 25 mg by mouth daily.   nitroGLYCERIN 0.4 MG SL tablet Commonly known as: NITROSTAT Place 0.4 mg under the tongue every 5 (five) minutes as needed for chest pain.   OVER THE COUNTER MEDICATION Take 3 capsules by mouth daily. Power c supplement   QUERCETIN PO Take 500 mg by mouth daily.   rosuvastatin 10 MG tablet Commonly known as: CRESTOR Take 10 mg by mouth at bedtime.   spironolactone 25 MG tablet Commonly known as: ALDACTONE Take 25 mg by mouth daily.   telmisartan 80 MG tablet Commonly known as:  MICARDIS Take 80 mg by mouth daily.   testosterone cypionate 200 MG/ML injection Commonly known as: DEPOTESTOSTERONE CYPIONATE Inject 0.6 mLs into the muscle as directed. 0.33ml every 12 days   vitamin B-12 1000 MCG tablet Commonly known as: CYANOCOBALAMIN Take  1,000 mcg by mouth daily.          Outstanding Labs/Studies   BMP on OV  Duration of Discharge Encounter   Greater than 30 minutes including physician time.  Signed, Cecilie Kicks, NP 05/15/2020, 5:22 PM

## 2020-05-15 NOTE — H&P (Addendum)
Cardiology Admission History and Physical:   Patient ID: Adam [name] MRN: [id number]; DOB: [birth year]   Admission date: 05/15/2020  PCP:  Ronita Hipps, MD   [facility] Providers Cardiologist:  Jenean Lindau, MD   05/14/2020 in-hospital     Chief Complaint:  Chest pain, CAD  Patient Profile:   Adam [name] is a 65 y.o. male with HTN, HLD, CAD w/ med mgt for distal LAD dz seen on cardiac CT 03/23/2020, hypothyroid, GERD, obesity, who is being seen 05/15/2020 for the evaluation of chest pain.  History of Present Illness:   Mr. Fails went to [facility] for recurrent chest pain, responsive to nitrates. He ruled out for MI, but symptoms were concerning for progressive angina. He was transferred to [facility] for cath.  Of note, no significant abnormalities in labs performed at [facility]. Covid negative.  He is currently having no pain or SOB. His wife and sister-in-law are present.  For additional information, see Dr Julien Nordmann consult note from [place].   Past Medical History:  Diagnosis Date  . Abnormal glucose 07/20/2015  . Acid reflux   . Acquired hypothyroidism 07/20/2015  . Angina pectoris ([facility]) 03/08/2020  . Cervical radiculopathy   . DDD (degenerative disc disease), cervical   . Erectile dysfunction 06/25/2018  . Gynecomastia, male 11/23/2017  . Hemoptysis   . Hyperprolactinemia ([place]) 07/20/2015  . Hypertension   . Hypogonadism in male 07/20/2015  . Mixed dyslipidemia 03/08/2020  . Obesity (BMI 30.0-34.9) 03/08/2020  . 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 Reflux     Past Surgical History:  Procedure Laterality Date  . CATARACT EXTRACTION    . RIGHT HEART CATH  2006  . ROTATOR CUFF REPAIR Right 04/2016     Medications Prior to Admission: Prior to Admission medications   Medication Sig Start Date End Date Taking? Authorizing Provider  Ascorbic Acid (VITAMIN C) 500 MG CHEW Chew 1,500 mg by mouth daily.    [provider]   aspirin EC 81 MG tablet Take 81 mg by mouth daily.    [provider]  budesonide-formoterol (SYMBICORT) 160-4.5 MCG/ACT inhaler Inhale 2 puffs into the lungs 2 (two) times daily as needed ([place] and shortness of breath).    [provider]  cabergoline (DOSTINEX) 0.5 MG tablet Take 0.5 tablets by mouth 2 (two) times a week. 03/06/20   [provider]  Cholecalciferol (VITAMIN D3) 1.25 MG (50000 UT) CAPS Take 1 capsule by mouth daily.    [provider]  Cyanocobalamin (B12 FAST DISSOLVE PO) Take 5,000 mcg by mouth daily.    [provider]  dexlansoprazole (DEXILANT) 60 MG capsule Take 60 mg by mouth daily.    [provider]  fluticasone (FLONASE) 50 MCG/ACT nasal spray Place 2 sprays into both nostrils daily.    [provider]  gabapentin (NEURONTIN) 300 MG capsule Take 300 mg by mouth 2 (two) times daily.    [provider]  levothyroxine (SYNTHROID) 88 MCG tablet Take 88 mcg by mouth daily. 04/09/20   [provider]  loratadine (CLARITIN) 10 MG tablet Take 10 mg by mouth daily.    [provider]  meloxicam (MOBIC) 15 MG tablet Take 15 mg by mouth daily. 12/28/19   [provider]  metoprolol succinate (TOPROL-XL) 25 MG 24 hr tablet Take 25 mg by mouth daily.    [provider]  nitroGLYCERIN (NITROSTAT) 0.4 MG SL tablet Place 0.4 mg under the tongue every 5 (five)  minutes as needed for chest pain.    [provider]  Omega-3 Fatty Acids (FISH OIL) 1000 MG CAPS Take 1 capsule by mouth daily.    [provider]  QUERCETIN PO Take 500 mg by mouth daily.    [provider]  rosuvastatin (CRESTOR) 10 MG tablet Take 10 mg by mouth at bedtime.    [provider]  spironolactone (ALDACTONE) 25 MG tablet Take 25 mg by mouth daily.    [provider]  tadalafil (CIALIS) 20 MG tablet Take 20 mg by mouth daily as needed for erectile dysfunction.     [provider]  telmisartan (MICARDIS) 80 MG tablet Take 80 mg by mouth daily.    [provider]  testosterone cypionate (DEPOTESTOSTERONE CYPIONATE) 200 MG/ML injection Inject 0.6 mLs into the muscle every 14 (fourteen) days.    [provider]  Zinc 100 MG TABS Take 1 tablet by mouth daily.    [provider]     Allergies:   No Known Allergies  Social History:   Social History   Socioeconomic History  . Marital status: Married    Spouse name: Not on file  . Number of children: Not on file  . Years of education: Not on file  . Highest education level: Not on file  Occupational History  . Not on file  Tobacco Use  . Smoking status: Never Smoker  . Smokeless tobacco: Never Used  Substance and Sexual Activity  . Alcohol use: Never  . Drug use: Never  . Sexual activity: Not on file  Other Topics Concern  . Not on file  Social History Narrative  . Not on file   Social Determinants of Health   Financial Resource Strain: Not on file  Food Insecurity: Not on file  Transportation Needs: Not on file  Physical Activity: Not on file  Stress: Not on file  Social Connections: Not on file  Intimate Partner Violence: Not on file    Family History:   The patient's family history includes Heart disease in his maternal grandfather; Hypertension in his brother, father, mother, and sister; Ovarian cancer in his maternal grandmother and mother; Pancreatic cancer in his father.   Family Status He indicated that his mother is deceased. He indicated that his father is deceased. He indicated that the status of his sister is unknown. He indicated that the status of his brother is unknown. He indicated that his maternal grandmother is deceased. He indicated that his maternal grandfather is deceased. He indicated that his paternal grandmother is deceased. He indicated that his paternal grandfather is deceased.   ROS:  Please see the history of present  illness.  All other ROS reviewed and negative.     Physical Exam/Data:   Vitals:   05/15/20 1102  BP: 129/73  Pulse: (!) 56  Resp: 17  Temp: 98.3 F (36.8 C)  TempSrc: Oral  SpO2: 98%  Weight: 116 kg  Height: 6\' 1"  (1.854 m)   No intake or output data in the 24 hours ending 05/15/20 1200 Last 3 Weights 05/15/2020 04/12/2020 03/08/2020  Weight (lbs) 255 lb 11.4 oz 263 lb 3.2 oz 265 lb  Weight (kg) 115.989 kg 119.387 kg 120.203 kg     Body mass index is 33.74 kg/m.  General:  Well nourished, well developed, in no acute distress HEENT: normal Lymph: no adenopathy Neck: no JVD Endocrine:  No thryomegaly Vascular: No carotid bruits; 4/4 extremity pulses 2+  Cardiac:  normal S1,  S2; RRR; no murmur  Lungs:  clear to auscultation bilaterally, no wheezing, rhonchi or rales  Abd: soft, nontender, no hepatomegaly  Ext: no edema Musculoskeletal:  No deformities, BUE and BLE strength normal and equal Skin: warm and dry  Neuro:  CNs 2-12 intact, no focal abnormalities noted Psych:  Normal affect    EKG:  The ECG that was done at [facility] was personally reviewed and demonstrates SR, no acute ischemic changes  Relevant CV Studies:  CARDIAC CT:  03/23/2020 Aorta: Normal size.  No calcifications.  No dissection.  Aortic Valve:  Trileaflet.  No calcifications.  Coronary Arteries:  Normal coronary origin.  Right dominance.  RCA is a large dominant artery that gives rise to PDA and PLVB. There is no plaque in the proximal RCA. Motion artifact in the mid and distal RCA with no clear visibility.  Left main is a large artery that gives rise to LAD and LCX arteries.  LAD is a large vessel. There is a moderate (50-69%) calcified plaque in the proximal LAD. The mid and distal LAD with no plaque.  LCX is a non-dominant artery that gives rise to one large OM1 branch. There is moderate (50-69%) calcified plaque in the proximal LCX. The mid and distal LCX with no plaques.  Other  findings:  Normal pulmonary vein drainage into the left atrium.  Normal left atrial appendage without a thrombus.  Normal size of the pulmonary artery.  IMPRESSION: 1. Coronary calcium score of 149. This was 37 percentile for age and sex matched control.  2. Normal coronary origin with right dominance.  3. Moderate Coronary artery disease. CADRADS 3. This study will be sent for FFRct.  FFRCT ANALYSIS  FINDINGS: FFRct analysis was performed on the original cardiac CT angiogram dataset. Diagrammatic representation of the FFRct analysis is provided in a separate PDF document in PACS. This dictation was created using the PDF document and an interactive 3D model of the results. 3D model is not available in the EMR/PACS. Normal FFR range is >0.80.  1. Left Main: 0.95  2. LAD: Proximal 0.92, Mid 0.85, Distal 0.75, distal tip 0.67  3. LCX: Proximal 0.95, Mid 0.95, Distal 0.91  4. RCA: Proximal 0.95, Mid 0.95, Distal RCA was not analyzed.  IMPRESSION: FFRct reports a flow limiting lesion in the distal LAD. Given this is the distal portion of the vessel, recommend optimization of guideline directed medical therapy to include antianginals prior to cardiac catheterization consideration.   Laboratory Data: See paper chart from [facility] Sensitivity Troponin:  No results for input(s): TROPONINIHS in the last 720 hours.    ChemistryNo results for input(s): NA, K, CL, CO2, GLUCOSE, BUN, CREATININE, CALCIUM, GFRNONAA, GFRAA, ANIONGAP in the last 168 hours.  No results for input(s): PROT, ALBUMIN, AST, ALT, ALKPHOS, BILITOT in the last 168 hours. HematologyNo results for input(s): WBC, RBC, HGB, HCT, MCV, MCH, MCHC, RDW, PLT in the last 168 hours. BNPNo results for input(s): BNP, PROBNP in the last 168 hours.  DDimer No results for input(s): DDIMER in the last 168 hours.   Radiology/Studies:  No results found.   Assessment and Plan:   1. Progressive  Angina - he had lovenox last pm at [place], but none this am - he has had ASA - he had CT w/ contrast at [facility], so need to be aggressive w/ hydration >> start ASAP, RN aware  2. HTN - BP stable, HR 50s, Sinus brady  3. Labs - Cr 1.20, hydrate well -  no significant abnormalities in other labs.    Risk Assessment/Risk Scores:    HEAR Score (for undifferentiated chest pain):  HEAR Score: 4     For questions or updates, please contact [place] Please consult [email or web address] for contact info under     Signed, Rosaria Ferries, PA-C  05/15/2020 12:00 PM    Patient seen and examined. Agree with assessment and plan.  Mr. Kahleel Fadeley is a 65 year old retired gentleman who previously worked for the [employer].  In November 2021 he developed COVID and was sick for 3 days with ultimate improvement following antibody infusion several days later.  He was unvaccinated and remains unvaccinated presently.  He has a history of hypertension, hyperlipidemia, thyroidism, GERD, and several months ago had experienced an episode of increasing shortness of breath upon going to bed.  In March 2022 he was referred for cardiac CTA which showed a calcium score at 149 representing 64th percentile for age and sex matched control.  He had moderate 50 to 69% calcified plaque in the proximal LAD and proximal circumflex vessel.  The mid distal LAD did not have plaque.  CTA was a large dominant vessel without obvious plaque although motion artifact was present.  FFR analysis was essentially normal with the exception of the distal LAD.  Medical management was initially recommended.  Yesterday, the patient had experienced chest pain which ultimately required 3 sublingual nitroglycerin for relief.  He elected to go to [facility].  He had some recurrent chest pain while there.  He was seen by Dr. Geraldo Pitter.  Now transferred to [facility] to undergo definitive cardiac catheterization.  He is pain free.   Pressure stable at 129/73.  He is in sinus rhythm, bradycardic at 56.  HEENT is unremarkable.  There is no JVD.  Lungs are clear.  There is no chest wall tenderness.  Rhythm is regular with no S3 or S4 gallop.  There is a with a systolic murmur.  Abdomen is soft and nontender.  Pulses are 2+.  There is no clubbing cyanosis or edema.  Neurologic exam is grossly nonfocal.  He has normal affect and mood.  ECG done at [facility] demonstrates sinus rhythm without acute ST-T abnormalities.  The patient is scheduled to undergo cardiac catheterization later this afternoon.  I reviewed the risk benefits of the procedure in detail and alternatives to cardiac catheterization, possible angioplasty, and stenting with the patient. Risks include but are not limited to bleeding, infection, vascular injury, stroke, myocardial infection, arrhythmia, kidney injury, radiation-related injury in the case of prolonged fluoroscopy use, emergency cardiac surgery, and death. The patient understands the risks of serious complication is 1-2 in 5462 with diagnostic cardiac cath and 1-2% or less with angioplasty/stenting.  The patient is in agreement to undergo this procedure.  Radial pulses strong and the procedure most likely can be done by the radial approach.   Troy Sine, MD, [facility] 05/15/2020 12:56 PM

## 2020-05-15 NOTE — Progress Notes (Signed)
PHARMACIST - PHYSICIAN ORDER COMMUNICATION  CONCERNING: P&T Medication Policy on Herbal Medications  DESCRIPTION:  This patient's order for:  Quercetin  has been noted.  This product(s) is classified as an "herbal" or natural product. Due to a lack of definitive safety studies or FDA approval, nonstandard manufacturing practices, plus the potential risk of unknown drug-drug interactions while on inpatient medications, the Pharmacy and Therapeutics Committee does not permit the use of "herbal" or natural products of this type within [facility]   ACTION TAKEN: The pharmacy department is unable to verify this order at this time and your patient has been informed of this safety policy. Please reevaluate patient's clinical condition at discharge and address if the herbal or natural product(s) should be resumed at that time.  Sherlon Handing, PharmD, BCPS Please see amion for complete clinical pharmacist phone list 05/15/2020 7:04 PM

## 2020-05-15 NOTE — Plan of Care (Signed)

## 2020-05-15 NOTE — Plan of Care (Signed)
Problem: Education: Goal: Knowledge of General Education information will improve Description: Including pain rating scale, medication(s)/side effects and non-pharmacologic comfort measures 05/15/2020 1759 by Augusto Garbe, RN Outcome: Adequate for Discharge 05/15/2020 1516 by Augusto Garbe, RN Outcome: Progressing   Problem: Health Behavior/Discharge Planning: Goal: Ability to manage health-related needs will improve 05/15/2020 1759 by Augusto Garbe, RN Outcome: Adequate for Discharge 05/15/2020 1516 by Augusto Garbe, RN Outcome: Progressing   Problem: Clinical Measurements: Goal: Ability to maintain clinical measurements within normal limits will improve 05/15/2020 1759 by Augusto Garbe, RN Outcome: Adequate for Discharge 05/15/2020 1516 by Augusto Garbe, RN Outcome: Progressing Goal: Will remain free from infection 05/15/2020 1759 by Augusto Garbe, RN Outcome: Adequate for Discharge 05/15/2020 1516 by Augusto Garbe, RN Outcome: Progressing Goal: Diagnostic test results will improve 05/15/2020 1759 by Augusto Garbe, RN Outcome: Adequate for Discharge 05/15/2020 1516 by Augusto Garbe, RN Outcome: Progressing Goal: Respiratory complications will improve 05/15/2020 1759 by Augusto Garbe, RN Outcome: Adequate for Discharge 05/15/2020 1516 by Augusto Garbe, RN Outcome: Progressing Goal: Cardiovascular complication will be avoided 05/15/2020 1759 by Augusto Garbe, RN Outcome: Adequate for Discharge 05/15/2020 1516 by Augusto Garbe, RN Outcome: Progressing   Problem: Activity: Goal: Risk for activity intolerance will decrease 05/15/2020 1759 by Augusto Garbe, RN Outcome: Adequate for Discharge 05/15/2020 1516 by Augusto Garbe, RN Outcome: Progressing   Problem: Nutrition: Goal: Adequate nutrition will be maintained 05/15/2020 1759 by Augusto Garbe, RN Outcome: Adequate for Discharge 05/15/2020 1516 by Augusto Garbe, RN Outcome: Progressing   Problem: Coping: Goal: Level of anxiety will decrease 05/15/2020 1759 by Augusto Garbe, RN Outcome: Adequate for  Discharge 05/15/2020 1516 by Augusto Garbe, RN Outcome: Progressing   Problem: Elimination: Goal: Will not experience complications related to bowel motility 05/15/2020 1759 by Augusto Garbe, RN Outcome: Adequate for Discharge 05/15/2020 1516 by Augusto Garbe, RN Outcome: Progressing Goal: Will not experience complications related to urinary retention 05/15/2020 1759 by Augusto Garbe, RN Outcome: Adequate for Discharge 05/15/2020 1516 by Augusto Garbe, RN Outcome: Progressing   Problem: Pain Managment: Goal: General experience of comfort will improve 05/15/2020 1759 by Augusto Garbe, RN Outcome: Adequate for Discharge 05/15/2020 1516 by Augusto Garbe, RN Outcome: Progressing   Problem: Safety: Goal: Ability to remain free from injury will improve 05/15/2020 1759 by Augusto Garbe, RN Outcome: Adequate for Discharge 05/15/2020 1516 by Augusto Garbe, RN Outcome: Progressing   Problem: Skin Integrity: Goal: Risk for impaired skin integrity will decrease 05/15/2020 1759 by Augusto Garbe, RN Outcome: Adequate for Discharge 05/15/2020 1516 by Augusto Garbe, RN Outcome: Progressing   Problem: Education: Goal: Understanding of CV disease, CV risk reduction, and recovery process will improve 05/15/2020 1759 by Augusto Garbe, RN Outcome: Adequate for Discharge 05/15/2020 1516 by Augusto Garbe, RN Outcome: Progressing Goal: Individualized Educational Video(s) 05/15/2020 1759 by Augusto Garbe, RN Outcome: Adequate for Discharge 05/15/2020 1516 by Augusto Garbe, RN Outcome: Progressing   Problem: Activity: Goal: Ability to return to baseline activity level will improve 05/15/2020 1759 by Augusto Garbe, RN Outcome: Adequate for Discharge 05/15/2020 1516 by Augusto Garbe, RN Outcome: Progressing   Problem: Cardiovascular: Goal: Ability to achieve and maintain adequate cardiovascular perfusion will improve 05/15/2020 1759 by Augusto Garbe, RN Outcome: Adequate for Discharge 05/15/2020 1516 by Augusto Garbe, RN Outcome: Progressing Goal: Vascular access site(s)  Level 0-1 will be maintained 05/15/2020 1759 by Augusto Garbe, RN Outcome: Adequate for Discharge 05/15/2020 1516 by Augusto Garbe, RN Outcome: Progressing   Problem: Health Behavior/Discharge Planning: Goal: Ability to safely manage health-related needs after  discharge will improve 05/15/2020 1759 by Augusto Garbe, RN Outcome: Adequate for Discharge 05/15/2020 1516 by Augusto Garbe, RN Outcome: Progressing

## 2020-05-15 NOTE — Discharge Instructions (Signed)
Call [facility] at [phone] if any bleeding, swelling or drainage at cath site.  May shower, no tub baths for 48 hours for groin sticks. No lifting over 5 pounds for 3 days.  No Driving for 3 days  Heart Healthy Diet  If chest pain continues call the office

## 2020-05-16 ENCOUNTER — Encounter (HOSPITAL_COMMUNITY): Payer: Self-pay | Admitting: Cardiology

## 2020-05-29 ENCOUNTER — Ambulatory Visit (INDEPENDENT_AMBULATORY_CARE_PROVIDER_SITE_OTHER): Payer: Commercial Managed Care - PPO | Admitting: Cardiology

## 2020-05-29 ENCOUNTER — Encounter: Payer: Self-pay | Admitting: Cardiology

## 2020-05-29 ENCOUNTER — Other Ambulatory Visit: Payer: Self-pay

## 2020-05-29 VITALS — BP 130/78 | HR 66 | Ht 73.0 in | Wt 252.2 lb

## 2020-05-29 DIAGNOSIS — E782 Mixed hyperlipidemia: Secondary | ICD-10-CM

## 2020-05-29 DIAGNOSIS — I2510 Atherosclerotic heart disease of native coronary artery without angina pectoris: Secondary | ICD-10-CM

## 2020-05-29 DIAGNOSIS — Z9889 Other specified postprocedural states: Secondary | ICD-10-CM | POA: Diagnosis not present

## 2020-05-29 DIAGNOSIS — E669 Obesity, unspecified: Secondary | ICD-10-CM

## 2020-05-29 NOTE — Progress Notes (Signed)
Cardiology Office Note:    Date:  05/29/2020   ID:  Adam [name], DOB [birth year], MRN [id number]  PCP:  Ronita Hipps, MD  Cardiologist:  Jenean Lindau, MD   Referring MD: Ronita Hipps, MD    ASSESSMENT:    No diagnosis found. PLAN:    In order of problems listed above:  1. Coronary artery disease: Minimal in nature.  Secondary prevention stressed with the patient.  Importance of compliance with diet medication stressed any vocalized understanding.  He is doing well to exercise and walk 2 miles a day and I congratulated him about this. 2. Essential hypertension: Blood pressure stable and diet was emphasized.  Lifestyle modification urged.  He complains of some dizziness suggesting of mild postural hypotension like symptoms.  His heart rate was also on the lower side in the 40s according to his wife so I will discontinue his Toprol. 3. Mixed dyslipidemia: Diet was emphasized.  Lipids were reviewed.  Risks of obesity explained and weight reduction was stressed and he promises to do better. 4. Patient will be seen in follow-up appointment in 6 months or earlier if the patient has any concerns    Medication Adjustments/Labs and Tests Ordered: Current medicines are reviewed at length with the patient today.  Concerns regarding medicines are outlined above.  No orders of the defined types were placed in this encounter.  No orders of the defined types were placed in this encounter.    No chief complaint on file.    History of Present Illness:    Adam [name] is a 65 y.o. male.  Patient has past medical history of coronary artery disease.  His CT coronary angiography revealed significant abnormality.  Patient has history of hypertension and dyslipidemia also.  He is overweight.  He was sent for coronary angiography and interestingly his coronaries were unremarkable.  Details of the report have been mentioned below.  He denies any chest pain orthopnea or PND.  His wife  accompanies him for this visit.  He tells me that he walks about half an hour a day and 2 miles on a regular basis.  At the time of my evaluation, the patient is alert awake oriented and in no distress.  Past Medical History:  Diagnosis Date  . Abnormal cardiac CT angiography   . Abnormal glucose 07/20/2015  . Acid reflux   . Acquired hypothyroidism 07/20/2015  . Angina pectoris ([place]) 03/08/2020  . CAD (coronary artery disease) 04/12/2020  . Cervical radiculopathy   . Chest pain of uncertain etiology 5/64/3329  . DDD (degenerative disc disease), cervical   . Erectile dysfunction 06/25/2018  . Gynecomastia, male 11/23/2017  . Hemoptysis   . Hyperprolactinemia ([place]) 07/20/2015  . Hypertension   . Hypogonadism in male 07/20/2015  . Mixed dyslipidemia 03/08/2020  . Obesity (BMI 30.0-34.9) 03/08/2020  . 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 Progressive angina ([place]) 05/15/2020  . S/P cardiac cath with coronary bridging but very minimal CAD -not cause of chest pain 05/15/2020    Past Surgical History:  Procedure Laterality Date  . CATARACT EXTRACTION    . LEFT HEART CATH AND CORONARY ANGIOGRAPHY N/A 05/15/2020   Procedure: LEFT HEART CATH AND CORONARY ANGIOGRAPHY;  Surgeon: Leonie Man, MD;  Location: [facility];  Service: Cardiovascular;  Laterality: N/A;  . RIGHT HEART CATH  2006  . ROTATOR CUFF REPAIR Right 04/2016    Current Medications: Current Meds  Medication Sig  . aspirin  EC 81 MG tablet Take 81 mg by mouth daily.  . cabergoline (DOSTINEX) 0.5 MG tablet Take 0.5 tablets by mouth 2 (two) times a week.  . Cholecalciferol (EQL VITAMIN D3) 25 MCG (1000 UT) capsule Take 1,000 Units by mouth daily.  Marland Kitchen dexlansoprazole (DEXILANT) 60 MG capsule Take 60 mg by mouth daily.  . fluticasone (FLONASE) 50 MCG/ACT nasal spray Place 1 spray into both nostrils as needed for rhinitis or allergies.  Marland Kitchen gabapentin (NEURONTIN) 300 MG capsule Take 300 mg by mouth 2 (two) times  daily.  Marland Kitchen levothyroxine (SYNTHROID) 88 MCG tablet Take 88 mcg by mouth daily.  Marland Kitchen loratadine (CLARITIN) 10 MG tablet Take 1 tablet (10 mg total) by mouth daily.  . meloxicam (MOBIC) 15 MG tablet Take 15 mg by mouth daily.  . metoprolol succinate (TOPROL-XL) 25 MG 24 hr tablet Take 25 mg by mouth daily.  . nitroGLYCERIN (NITROSTAT) 0.4 MG SL tablet Place 0.4 mg under the tongue every 5 (five) minutes as needed for chest pain.  . Omega-3 Fatty Acids (FISH OIL) 1000 MG CAPS Take 1,000 mg by mouth daily.  Marland Kitchen OVER THE COUNTER MEDICATION Take 3 capsules by mouth daily. Power c supplement  . QUERCETIN PO Take 500 mg by mouth daily.  . rosuvastatin (CRESTOR) 10 MG tablet Take 10 mg by mouth at bedtime.  Marland Kitchen spironolactone (ALDACTONE) 25 MG tablet Take 25 mg by mouth daily.  . SYMBICORT 160-4.5 MCG/ACT inhaler Inhale 1-2 puffs into the lungs as needed for shortness of breath or wheezing.  Marland Kitchen telmisartan (MICARDIS) 80 MG tablet Take 80 mg by mouth daily.  Marland Kitchen testosterone cypionate (DEPOTESTOSTERONE CYPIONATE) 200 MG/ML injection Inject 0.6 mLs into the muscle as directed. 0.8ml every 12 days  . vitamin B-12 (CYANOCOBALAMIN) 1000 MCG tablet Take 1,000 mcg by mouth daily.     Allergies:   Patient has no known allergies.   Social History   Socioeconomic History  . Marital status: Married    Spouse name: Not on file  . Number of children: Not on file  . Years of education: Not on file  . Highest education level: Not on file  Occupational History  . Not on file  Tobacco Use  . Smoking status: Never Smoker  . Smokeless tobacco: Never Used  Substance and Sexual Activity  . Alcohol use: Never  . Drug use: Never  . Sexual activity: Not on file  Other Topics Concern  . Not on file  Social History Narrative  . Not on file   Social Determinants of Health   Financial Resource Strain: Not on file  Food Insecurity: Not on file  Transportation Needs: Not on file  Physical Activity: Not on file   Stress: Not on file  Social Connections: Not on file     Family History: The patient's family history includes Heart disease in his maternal grandfather; Hypertension in his brother, father, mother, and sister; Ovarian cancer in his maternal grandmother and mother; Pancreatic cancer in his father.  ROS:   Please see the history of present illness.    All other systems reviewed and are negative.  EKGs/Labs/Other Studies Reviewed:    The following studies were reviewed today: Leonie Man, MD (Primary)      Procedures  LEFT HEART CATH AND CORONARY ANGIOGRAPHY   Conclusion    The left ventricular systolic function is normal. The left ventricular ejection fraction is 55-65% by visual estimate.  LV end diastolic pressure is normal.  There is no aortic  valve stenosis.  Mostly normal coronaries with a Left Dominant Coronary System  Prox LAD lesion is 5% stenosed. - > This site was considered to be moderate by coronary CTA, but likely because the vessel dives down potentially under the myocardium temporarily.  Mid LAD-1 lesion is 10% stenosed. Mid LAD-2 lesion is 10% stenosed. -2 segments of mild kinking likely the beginning and end of the myocardial segment.   SUMMARY  Angiographically minimal CAD with 2 focal areas of subendocardial bridging in the LAD but no stenoses.  Normal EF with normal EDP.    RECOMMENDATIONS  Consider noncardiac chest pain  For resting chest pain if it is cardiac, could consider vasodilatory agents for possible spasm.      Recent Labs: 03/16/2020: ALT 20; BUN 27; Creatinine, Ser 1.41; Hemoglobin 16.1; Platelets 157; Potassium 4.5; Sodium 139; TSH 1.090  Recent Lipid Panel    Component Value Date/Time   CHOL 129 03/16/2020 0837   TRIG 104 03/16/2020 0837   HDL 40 03/16/2020 0837   CHOLHDL 3.2 03/16/2020 0837   LDLCALC 70 03/16/2020 0837    Physical Exam:    VS:  BP 130/78   Pulse 66   Ht 6\' 1"  (1.854 m)   Wt 252 lb 3.2  oz (114.4 kg)   SpO2 98%   BMI 33.27 kg/m     Wt Readings from Last 3 Encounters:  05/29/20 252 lb 3.2 oz (114.4 kg)  05/15/20 255 lb 11.4 oz (116 kg)  04/12/20 263 lb 3.2 oz (119.4 kg)     GEN: Patient is in no acute distress HEENT: Normal NECK: No JVD; No carotid bruits LYMPHATICS: No lymphadenopathy CARDIAC: Hear sounds regular, 2/6 systolic murmur at the apex. RESPIRATORY:  Clear to auscultation without rales, wheezing or rhonchi  ABDOMEN: Soft, non-tender, non-distended MUSCULOSKELETAL:  No edema; No deformity  SKIN: Warm and dry NEUROLOGIC:  Alert and oriented x 3 PSYCHIATRIC:  Normal affect   Signed, Jenean Lindau, MD  05/29/2020 2:07 PM    [facility]

## 2020-05-29 NOTE — Patient Instructions (Signed)
Medication Instructions:  Your physician has recommended you make the following change in your medication:   Stop Toprol  *If you need a refill on your cardiac medications before your next appointment, please call your pharmacy*   Lab Work: None ordered If you have labs (blood work) drawn today and your tests are completely normal, you will receive your results only by: Marland Kitchen MyChart Message (if you have MyChart) OR . A paper copy in the mail If you have any lab test that is abnormal or we need to change your treatment, we will call you to review the results.   Testing/Procedures: None ordered   Follow-Up: At [facility], you and your health needs are our priority.  As part of our continuing mission to provide you with exceptional heart care, we have created designated Provider Care Teams.  These Care Teams include your primary Cardiologist (physician) and Advanced Practice Providers (APPs -  Physician Assistants and Nurse Practitioners) who all work together to provide you with the care you need, when you need it.  We recommend signing up for the patient portal called "MyChart".  Sign up information is provided on this After Visit Summary.  MyChart is used to connect with patients for Virtual Visits (Telemedicine).  Patients are able to view lab/test results, encounter notes, upcoming appointments, etc.  Non-urgent messages can be sent to your provider as well.   To learn more about what you can do with MyChart, go to [email or web address].    Your next appointment:   6 month(s)  The format for your next appointment:   In Person  Provider:   Jyl Heinz, MD   Other Instructions NA

## 2020-08-13 ENCOUNTER — Ambulatory Visit: Payer: Commercial Managed Care - PPO | Admitting: Cardiology

## 2021-01-21 ENCOUNTER — Ambulatory Visit: Payer: Managed Care, Other (non HMO) | Admitting: Cardiology

## 2021-01-21 ENCOUNTER — Other Ambulatory Visit: Payer: Self-pay

## 2021-01-21 ENCOUNTER — Encounter: Payer: Self-pay | Admitting: Cardiology

## 2021-01-21 VITALS — BP 128/68 | HR 72 | Ht 73.0 in | Wt 247.2 lb

## 2021-01-21 DIAGNOSIS — I2510 Atherosclerotic heart disease of native coronary artery without angina pectoris: Secondary | ICD-10-CM | POA: Diagnosis not present

## 2021-01-21 DIAGNOSIS — I10 Essential (primary) hypertension: Secondary | ICD-10-CM

## 2021-01-21 DIAGNOSIS — E782 Mixed hyperlipidemia: Secondary | ICD-10-CM

## 2021-01-21 NOTE — Patient Instructions (Signed)

## 2021-01-21 NOTE — Progress Notes (Signed)
Cardiology Office Note:    Date:  01/21/2021   ID:  Adam [name], DOB 15-Oct-1955, MRN [id number]  PCP:  Ronita Hipps, MD  Cardiologist:  Jenean Lindau, MD   Referring MD: Ronita Hipps, MD    ASSESSMENT:    1. Coronary artery disease involving native coronary artery of native heart without angina pectoris   2. Primary hypertension   3. Mixed dyslipidemia    PLAN:    In order of problems listed above:  Coronary artery disease: Secondary prevention stressed with the patient.  Importance of compliance with diet medication stressed and she vocalized understanding.  He is walking well and I emphasized the importance of continuing good exercise. Essential hypertension: Blood pressure stable and diet was emphasized.  Salt intake issues were discussed. Mixed dyslipidemia: Lipids followed by primary care and he is going to send [place] a copy of his reports that he has with him. Obesity: Weight reduction stressed risks of obesity explained and he promises to do better. Patient will be seen in follow-up appointment in 6 months or earlier if the patient has any concerns    Medication Adjustments/Labs and Tests Ordered: Current medicines are reviewed at length with the patient today.  Concerns regarding medicines are outlined above.  No orders of the defined types were placed in this encounter.  No orders of the defined types were placed in this encounter.    No chief complaint on file.    History of Present Illness:    Adam [name] is a 66 y.o. male.  Patient has past medical history of coronary artery disease, essential hypertension and dyslipidemia.  He denies any problems at this time and takes care of activities of daily living.  No chest pain orthopnea or PND.  He has not lost weight appropriately as we discussed at the last meeting.  He walks about 3 miles on a regular basis.  Diabetes is lax with his diet.  At the time of my evaluation, the patient is alert awake oriented  and in no distress.  Past Medical History:  Diagnosis Date   Abnormal cardiac CT angiography    Abnormal glucose 07/20/2015   Acid reflux    Acquired hypothyroidism 07/20/2015   Angina pectoris ([place]) 03/08/2020   CAD (coronary artery disease) 04/12/2020   Cervical radiculopathy    Chest pain of uncertain etiology 8/85/0277   DDD (degenerative disc disease), cervical    Erectile dysfunction 06/25/2018   Gynecomastia, male 11/23/2017   Hemoptysis    Hyperprolactinemia ([place]) 07/20/2015   Hypertension    Hypogonadism in male 07/20/2015   Mixed dyslipidemia 03/08/2020   Obesity (BMI 30.0-34.9) 03/08/2020   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[place])    Progressive angina ([place]) 05/15/2020   S/P cardiac cath with coronary bridging but very minimal CAD -not cause of chest pain 05/15/2020    Past Surgical History:  Procedure Laterality Date   CATARACT EXTRACTION     LEFT HEART CATH AND CORONARY ANGIOGRAPHY N/A 05/15/2020   Procedure: LEFT HEART CATH AND CORONARY ANGIOGRAPHY;  Surgeon: Leonie Man, MD;  Location: [facility];  Service: Cardiovascular;  Laterality: N/A;   RIGHT HEART CATH  2006   ROTATOR CUFF REPAIR Right 04/2016    Current Medications: Current Meds  Medication Sig   aspirin EC 81 MG tablet Take 81 mg by mouth daily.   cabergoline (DOSTINEX) 0.5 MG tablet Take 0.5 tablets by mouth 2 (two) times a week.   Cholecalciferol (EQL  VITAMIN D3) 25 MCG (1000 UT) capsule Take 1,000 Units by mouth daily.   dexlansoprazole (DEXILANT) 60 MG capsule Take 60 mg by mouth daily.   fluticasone (FLONASE) 50 MCG/ACT nasal spray Place 1 spray into both nostrils as needed for rhinitis or allergies.   gabapentin (NEURONTIN) 300 MG capsule Take 300 mg by mouth 2 (two) times daily.   levothyroxine (SYNTHROID) 88 MCG tablet Take 88 mcg by mouth daily.   loratadine (CLARITIN) 10 MG tablet Take 1 tablet (10 mg total) by mouth daily.   meloxicam (MOBIC) 15 MG tablet Take 15 mg by mouth  daily.   nitroGLYCERIN (NITROSTAT) 0.4 MG SL tablet Place 0.4 mg under the tongue every 5 (five) minutes as needed for chest pain.   Omega-3 Fatty Acids (FISH OIL) 1000 MG CAPS Take 1,000 mg by mouth daily.   OVER THE COUNTER MEDICATION Take 3 capsules by mouth daily. Power c supplement   QUERCETIN PO Take 500 mg by mouth daily.   rosuvastatin (CRESTOR) 10 MG tablet Take 10 mg by mouth at bedtime.   spironolactone (ALDACTONE) 25 MG tablet Take 25 mg by mouth daily.   SYMBICORT 160-4.5 MCG/ACT inhaler Inhale 1-2 puffs into the lungs as needed for shortness of breath or wheezing.   telmisartan (MICARDIS) 80 MG tablet Take 80 mg by mouth daily.   testosterone cypionate (DEPOTESTOSTERONE CYPIONATE) 200 MG/ML injection Inject 0.6 mLs into the muscle as directed. 0.76ml every 12 days   vitamin B-12 (CYANOCOBALAMIN) 1000 MCG tablet Take 1,000 mcg by mouth daily.     Allergies:   Patient has no known allergies.   Social History   Socioeconomic History   Marital status: Married    Spouse name: Not on file   Number of children: Not on file   Years of education: Not on file   Highest education level: Not on file  Occupational History   Not on file  Tobacco Use   Smoking status: Never   Smokeless tobacco: Never  Substance and Sexual Activity   Alcohol use: Never   Drug use: Never   Sexual activity: Not on file  Other Topics Concern   Not on file  Social History Narrative   Not on file   Social Determinants of Health   Financial Resource Strain: Not on file  Food Insecurity: Not on file  Transportation Needs: Not on file  Physical Activity: Not on file  Stress: Not on file  Social Connections: Not on file     Family History: The patient's family history includes Heart disease in his maternal grandfather; Hypertension in his brother, father, mother, and sister; Ovarian cancer in his maternal grandmother and mother; Pancreatic cancer in his father.  ROS:   Please see the history of  present illness.    All other systems reviewed and are negative.  EKGs/Labs/Other Studies Reviewed:    The following studies were reviewed today: I discussed my findings with the patient at length.   Recent Labs: 03/16/2020: ALT 20; BUN 27; Creatinine, Ser 1.41; Hemoglobin 16.1; Platelets 157; Potassium 4.5; Sodium 139; TSH 1.090  Recent Lipid Panel    Component Value Date/Time   CHOL 129 03/16/2020 0837   TRIG 104 03/16/2020 0837   HDL 40 03/16/2020 0837   CHOLHDL 3.2 03/16/2020 0837   LDLCALC 70 03/16/2020 0837    Physical Exam:    VS:  BP 128/68    Pulse 72    Ht 6\' 1"  (1.854 m)    Wt 247 lb  3.2 oz (112.1 kg)    SpO2 95%    BMI 32.61 kg/m     Wt Readings from Last 3 Encounters:  01/21/21 247 lb 3.2 oz (112.1 kg)  05/29/20 252 lb 3.2 oz (114.4 kg)  05/15/20 255 lb 11.4 oz (116 kg)     GEN: Patient is in no acute distress HEENT: Normal NECK: No JVD; No carotid bruits LYMPHATICS: No lymphadenopathy CARDIAC: Hear sounds regular, 2/6 systolic murmur at the apex. RESPIRATORY:  Clear to auscultation without rales, wheezing or rhonchi  ABDOMEN: Soft, non-tender, non-distended MUSCULOSKELETAL:  No edema; No deformity  SKIN: Warm and dry NEUROLOGIC:  Alert and oriented x 3 PSYCHIATRIC:  Normal affect   Signed, Jenean Lindau, MD  01/21/2021 4:18 PM    [facility]

## 2021-01-22 LAB — BASIC METABOLIC PANEL
BUN/Creatinine Ratio: 15 (ref 10–24)
BUN: 18 mg/dL (ref 8–27)
CO2: 26 mmol/L (ref 20–29)
Calcium: 9.1 mg/dL (ref 8.6–10.2)
Chloride: 103 mmol/L (ref 96–106)
Creatinine, Ser: 1.21 mg/dL (ref 0.76–1.27)
Glucose: 106 mg/dL — ABNORMAL HIGH (ref 70–99)
Potassium: 4.3 mmol/L (ref 3.5–5.2)
Sodium: 140 mmol/L (ref 134–144)
eGFR: 67 mL/min/{1.73_m2} (ref >59)

## 2021-01-22 LAB — HEPATIC FUNCTION PANEL
ALT: 19 IU/L (ref 0–44)
AST: 29 IU/L (ref 0–40)
Albumin: 4.6 g/dL (ref 3.8–4.8)
Alkaline Phosphatase: 77 IU/L (ref 44–121)
Bilirubin Total: 0.8 mg/dL (ref 0.0–1.2)
Bilirubin, Direct: 0.23 mg/dL (ref 0.00–0.40)
Total Protein: 7 g/dL (ref 6.0–8.5)

## 2021-01-22 LAB — LIPID PANEL
Chol/HDL Ratio: 2.8 ratio (ref 0.0–5.0)
Cholesterol, Total: 125 mg/dL (ref 100–199)
HDL: 45 mg/dL (ref >39)
LDL Chol Calc (NIH): 63 mg/dL (ref 0–99)
Triglycerides: 85 mg/dL (ref 0–149)
VLDL Cholesterol Cal: 17 mg/dL (ref 5–40)

## 2021-02-20 DIAGNOSIS — S81012A Laceration without foreign body, left knee, initial encounter: Secondary | ICD-10-CM | POA: Diagnosis not present

## 2021-03-06 DIAGNOSIS — S81012S Laceration without foreign body, left knee, sequela: Secondary | ICD-10-CM | POA: Diagnosis not present

## 2021-03-06 DIAGNOSIS — Z6832 Body mass index (BMI) 32.0-32.9, adult: Secondary | ICD-10-CM | POA: Diagnosis not present

## 2021-03-06 DIAGNOSIS — G4700 Insomnia, unspecified: Secondary | ICD-10-CM | POA: Diagnosis not present

## 2021-03-11 DIAGNOSIS — E291 Testicular hypofunction: Secondary | ICD-10-CM | POA: Diagnosis not present

## 2021-03-22 DIAGNOSIS — E291 Testicular hypofunction: Secondary | ICD-10-CM | POA: Diagnosis not present

## 2021-04-04 DIAGNOSIS — E291 Testicular hypofunction: Secondary | ICD-10-CM | POA: Diagnosis not present

## 2021-04-29 DIAGNOSIS — E291 Testicular hypofunction: Secondary | ICD-10-CM | POA: Diagnosis not present

## 2021-05-13 DIAGNOSIS — E291 Testicular hypofunction: Secondary | ICD-10-CM | POA: Diagnosis not present

## 2021-05-24 DIAGNOSIS — Z9181 History of falling: Secondary | ICD-10-CM | POA: Diagnosis not present

## 2021-05-24 DIAGNOSIS — Z125 Encounter for screening for malignant neoplasm of prostate: Secondary | ICD-10-CM | POA: Diagnosis not present

## 2021-05-24 DIAGNOSIS — E782 Mixed hyperlipidemia: Secondary | ICD-10-CM | POA: Diagnosis not present

## 2021-05-24 DIAGNOSIS — R7309 Other abnormal glucose: Secondary | ICD-10-CM | POA: Diagnosis not present

## 2021-05-24 DIAGNOSIS — Z79899 Other long term (current) drug therapy: Secondary | ICD-10-CM | POA: Diagnosis not present

## 2021-05-24 DIAGNOSIS — I10 Essential (primary) hypertension: Secondary | ICD-10-CM | POA: Diagnosis not present

## 2021-05-24 DIAGNOSIS — M1990 Unspecified osteoarthritis, unspecified site: Secondary | ICD-10-CM | POA: Diagnosis not present

## 2021-05-24 DIAGNOSIS — Z6832 Body mass index (BMI) 32.0-32.9, adult: Secondary | ICD-10-CM | POA: Diagnosis not present

## 2021-05-24 DIAGNOSIS — E291 Testicular hypofunction: Secondary | ICD-10-CM | POA: Diagnosis not present

## 2021-06-05 DIAGNOSIS — E291 Testicular hypofunction: Secondary | ICD-10-CM | POA: Diagnosis not present

## 2021-06-18 DIAGNOSIS — E291 Testicular hypofunction: Secondary | ICD-10-CM | POA: Diagnosis not present

## 2021-06-27 DIAGNOSIS — R7309 Other abnormal glucose: Secondary | ICD-10-CM | POA: Diagnosis not present

## 2021-06-27 DIAGNOSIS — E039 Hypothyroidism, unspecified: Secondary | ICD-10-CM | POA: Diagnosis not present

## 2021-06-27 DIAGNOSIS — E221 Hyperprolactinemia: Secondary | ICD-10-CM | POA: Diagnosis not present

## 2021-06-27 DIAGNOSIS — E291 Testicular hypofunction: Secondary | ICD-10-CM | POA: Diagnosis not present

## 2021-07-01 DIAGNOSIS — E291 Testicular hypofunction: Secondary | ICD-10-CM | POA: Diagnosis not present

## 2021-07-15 DIAGNOSIS — E291 Testicular hypofunction: Secondary | ICD-10-CM | POA: Diagnosis not present

## 2021-07-26 DIAGNOSIS — E291 Testicular hypofunction: Secondary | ICD-10-CM | POA: Diagnosis not present

## 2021-08-07 DIAGNOSIS — E291 Testicular hypofunction: Secondary | ICD-10-CM | POA: Diagnosis not present

## 2021-08-30 DIAGNOSIS — E291 Testicular hypofunction: Secondary | ICD-10-CM | POA: Diagnosis not present

## 2021-09-11 DIAGNOSIS — E291 Testicular hypofunction: Secondary | ICD-10-CM | POA: Diagnosis not present

## 2021-09-23 DIAGNOSIS — E291 Testicular hypofunction: Secondary | ICD-10-CM | POA: Diagnosis not present

## 2021-10-01 DIAGNOSIS — H52223 Regular astigmatism, bilateral: Secondary | ICD-10-CM | POA: Diagnosis not present

## 2021-10-01 DIAGNOSIS — Z961 Presence of intraocular lens: Secondary | ICD-10-CM | POA: Diagnosis not present

## 2021-10-01 DIAGNOSIS — H5203 Hypermetropia, bilateral: Secondary | ICD-10-CM | POA: Diagnosis not present

## 2021-10-04 DIAGNOSIS — E291 Testicular hypofunction: Secondary | ICD-10-CM | POA: Diagnosis not present

## 2021-10-04 DIAGNOSIS — Z23 Encounter for immunization: Secondary | ICD-10-CM | POA: Diagnosis not present

## 2021-10-17 DIAGNOSIS — E349 Endocrine disorder, unspecified: Secondary | ICD-10-CM | POA: Diagnosis not present

## 2021-10-29 DIAGNOSIS — E291 Testicular hypofunction: Secondary | ICD-10-CM | POA: Diagnosis not present

## 2021-11-04 DIAGNOSIS — D485 Neoplasm of uncertain behavior of skin: Secondary | ICD-10-CM | POA: Diagnosis not present

## 2021-11-12 DIAGNOSIS — E291 Testicular hypofunction: Secondary | ICD-10-CM | POA: Diagnosis not present

## 2021-11-26 DIAGNOSIS — E349 Endocrine disorder, unspecified: Secondary | ICD-10-CM | POA: Diagnosis not present

## 2021-11-26 DIAGNOSIS — Z6832 Body mass index (BMI) 32.0-32.9, adult: Secondary | ICD-10-CM | POA: Diagnosis not present

## 2021-11-26 DIAGNOSIS — R29818 Other symptoms and signs involving the nervous system: Secondary | ICD-10-CM | POA: Diagnosis not present

## 2021-12-03 ENCOUNTER — Encounter: Payer: Self-pay | Admitting: Neurology

## 2021-12-03 DIAGNOSIS — D485 Neoplasm of uncertain behavior of skin: Secondary | ICD-10-CM | POA: Diagnosis not present

## 2021-12-09 DIAGNOSIS — E349 Endocrine disorder, unspecified: Secondary | ICD-10-CM | POA: Diagnosis not present

## 2021-12-20 DIAGNOSIS — E349 Endocrine disorder, unspecified: Secondary | ICD-10-CM | POA: Diagnosis not present

## 2021-12-27 DIAGNOSIS — E221 Hyperprolactinemia: Secondary | ICD-10-CM | POA: Diagnosis not present

## 2021-12-27 DIAGNOSIS — E039 Hypothyroidism, unspecified: Secondary | ICD-10-CM | POA: Diagnosis not present

## 2021-12-27 DIAGNOSIS — E291 Testicular hypofunction: Secondary | ICD-10-CM | POA: Diagnosis not present

## 2021-12-27 DIAGNOSIS — Z125 Encounter for screening for malignant neoplasm of prostate: Secondary | ICD-10-CM | POA: Diagnosis not present

## 2022-01-01 DIAGNOSIS — E349 Endocrine disorder, unspecified: Secondary | ICD-10-CM | POA: Diagnosis not present

## 2022-01-13 DIAGNOSIS — E349 Endocrine disorder, unspecified: Secondary | ICD-10-CM | POA: Diagnosis not present

## 2022-01-15 NOTE — Progress Notes (Signed)
Initial neurology clinic note  SERVICE DATE: 01/22/22  Reason for Evaluation: Consultation requested by Ronita Hipps, MD for an opinion regarding imbalance. My final recommendations will be communicated back to the requesting physician by way of shared medical record or letter to requesting physician via [place] mail.  HPI: This is Mr. Adam [name], a 67 y.o. right-handed male with a medical history of HTN, HLD, pituitary microadenoma c/b hypothyroidism, degenerative disc disease who presents to neurology clinic with the chief complaint of imbalance and numbness and tingling in feet. The patient is accompanied by wife.  Patient was walking on an outside track which he does multiple times per week. He started feeling like his feet were going to "fly out from under" him. This started around the summer of 2023. He stopped walking in 10/2021.   On a shining floor, patient felt like he was going to fall. He felt like he had to shuffle his feet in this situation. He does not have this problem when walking on grass. He has not had any recent falls.  He had back injury around 08/2005. He was weed eating a bank. He had pain in his leg radiating from his back. He was given a muscle relaxer.  His pain went away but he was left with numbness in left leg below the calf. He was told a nerve was dead. He has tingling in the left foot and more recently now in the right leg. He has occasionally cramping in the left calf, but this has improved compared to prior. He currently has no significant back pain. He has never an EMG.   Patient takes gabapentin 300 mg BID for "cough neuropathy". This does not help the symptoms in his feet. Patient is on B12 and vit D supplementation. He did this on his own, not because he was ever deficient.  The patient does not report symptoms referable to autonomic dysfunction including impaired sweating, heat or cold intolerance, excessive mucosal dryness, gastroparetic early satiety,  postprandial abdominal bloating, constipation, bowel or bladder dyscontrol, or syncope/presyncope/orthostatic intolerance (only rarely).  He does not report any constitutional symptoms like fever, night sweats, anorexia or unintentional weight loss.  EtOH use: none  Restrictive diet? no Family history of neuropathy/myopathy/NM disease? No  He has not had PT recently.   MEDICATIONS:  Outpatient Encounter Medications as of 01/22/2022  Medication Sig   aspirin EC 81 MG tablet Take 81 mg by mouth daily.   cabergoline (DOSTINEX) 0.5 MG tablet Take 0.5 tablets by mouth 2 (two) times a week. 1/2 pill one time a week   Cholecalciferol (EQL VITAMIN D3) 25 MCG (1000 UT) capsule Take 1,000 Units by mouth daily.   gabapentin (NEURONTIN) 300 MG capsule Take 300 mg by mouth 2 (two) times daily.   levothyroxine (SYNTHROID) 88 MCG tablet Take 88 mcg by mouth daily.   loratadine (CLARITIN) 10 MG tablet Take 1 tablet (10 mg total) by mouth daily.   meloxicam (MOBIC) 15 MG tablet Take 15 mg by mouth daily.   nitroGLYCERIN (NITROSTAT) 0.4 MG SL tablet Place 0.4 mg under the tongue every 5 (five) minutes as needed for chest pain.   Omega-3 Fatty Acids (FISH OIL) 1000 MG CAPS Take 1,000 mg by mouth daily.   OVER THE COUNTER MEDICATION Take 3 capsules by mouth daily. Power c supplement   QUERCETIN PO Take 500 mg by mouth daily.   rosuvastatin (CRESTOR) 10 MG tablet Take 10 mg by mouth at bedtime.   spironolactone (  ALDACTONE) 25 MG tablet Take 25 mg by mouth daily.   SYMBICORT 160-4.5 MCG/ACT inhaler Inhale 1-2 puffs into the lungs as needed for shortness of breath or wheezing.   telmisartan (MICARDIS) 80 MG tablet Take 80 mg by mouth daily.   testosterone cypionate (DEPOTESTOSTERONE CYPIONATE) 200 MG/ML injection Inject 0.6 mLs into the muscle as directed. 0.52m every 12 days   vitamin B-12 (CYANOCOBALAMIN) 1000 MCG tablet Take 1,000 mcg by mouth daily.   zolpidem (AMBIEN) 10 MG tablet Take 10 mg by mouth at  bedtime as needed for sleep.   dexlansoprazole (DEXILANT) 60 MG capsule Take 60 mg by mouth daily. (Patient not taking: Reported on 01/22/2022)   fluticasone (FLONASE) 50 MCG/ACT nasal spray Place 1 spray into both nostrils as needed for rhinitis or allergies. (Patient not taking: Reported on 01/22/2022)   No facility-administered encounter medications on file as of 01/22/2022.    PAST MEDICAL HISTORY: Past Medical History:  Diagnosis Date   Abnormal cardiac CT angiography    Abnormal glucose 07/20/2015   Acid reflux    Acquired hypothyroidism 07/20/2015   Angina pectoris ([facility] 03/08/2020   CAD (coronary artery disease) 04/12/2020   Cervical radiculopathy    Chest pain of uncertain etiology 50/93/2355  DDD (degenerative disc disease), cervical    Erectile dysfunction 06/25/2018   Gynecomastia, male 11/23/2017   Hemoptysis    [place]lactinemia ([facility] 07/20/2015   Hypertension    Hypogonadism in male 07/20/2015   Mixed dyslipidemia 03/08/2020   Obesity (BMI 30.0-34.9) 03/08/2020   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HCC)    Progressive angina ([facility] 05/15/2020   S/P cardiac cath with coronary bridging but very minimal CAD -not cause of chest pain 05/15/2020    PAST SURGICAL HISTORY: Past Surgical History:  Procedure Laterality Date   CATARACT EXTRACTION     LEFT HEART CATH AND CORONARY ANGIOGRAPHY N/A 05/15/2020   Procedure: LEFT HEART CATH AND CORONARY ANGIOGRAPHY;  Surgeon: HLeonie Man MD;  [facility];  Service: Cardiovascular;  Laterality: N/A;   RIGHT HEART CATH  2006   ROTATOR CUFF REPAIR Right 04/2016    ALLERGIES: No Known Allergies  FAMILY HISTORY: Family History  Problem Relation Age of Onset   Hypertension Mother    Ovarian cancer Mother    Hypertension Father    Pancreatic cancer Father    Ovarian cancer Maternal Grandmother    Heart disease Maternal Grandfather    Hypertension Sister    Hypertension Brother     SOCIAL  HISTORY: Social History   Tobacco Use   Smoking status: Never   Smokeless tobacco: Never  Vaping Use   Vaping Use: Never used  Substance Use Topics   Alcohol use: Never   Drug use: Never   Social History   Social History Narrative   Are you right handed or left handed? Right    Are you currently employed ? no   What is your current occupation? retired   [place]? Wife and dog   What type of home do you live in: 1 story or 2 story? One    Caffeine 2 soda a day     OBJECTIVE: PHYSICAL EXAM: BP (!) 150/79   Pulse 64   Wt 249 lb 6.4 oz (113.1 kg)   SpO2 96%   BMI 32.90 kg/m   General: General appearance: Awake and alert. No distress. Cooperative with exam.  Skin: No obvious rash or  jaundice. HEENT: Atraumatic. Anicteric. Lungs: Non-labored breathing on room air  Heart: Regular Extremities: No edema. No obvious deformity.  Musculoskeletal: No obvious joint swelling. Psych: Affect appropriate.  Neurological: Mental Status: Alert. Speech fluent. No pseudobulbar affect Cranial Nerves: CNII: No RAPD. Visual fields grossly intact. CNIII, IV, VI: PERRL. No nystagmus. EOMI. CN V: Facial sensation intact bilaterally to fine touch. [place] clench strong. CN VII: Facial muscles symmetric and strong. No ptosis at rest. CN VIII: Hearing grossly intact bilaterally. CN IX: No hypophonia. CN X: Palate elevates symmetrically. CN XI: Full strength shoulder shrug bilaterally. CN XII: Tongue protrusion full and midline. No atrophy or fasciculations. No significant dysarthria Motor: Tone is normal. No atrophy.  Individual muscle group testing (MRC grade out of 5):  Movement     Neck flexion 5    Neck extension 5     Right Left   Shoulder abduction 5 5   Elbow flexion 5 5   Elbow extension 5 5   Finger abduction - FDI 5 5   Finger abduction - ADM 5 5   Finger extension 5 5   Finger distal flexion - 2/'3 5 5   '$ Finger distal  flexion - 4/'5 5 5   '$ Thumb flexion - FPL 5 5   Thumb abduction - APB 5 5    Hip flexion 5 5   Hip extension 5 5   Hip adduction 5 5   Hip abduction 5 5   Knee extension 5 5   Knee flexion 5 5   Dorsiflexion 5 5   Plantarflexion 5 5   Great toe extension 5 5   Great toe flexion 4+ 4+     Reflexes:  Right Left   Bicep 1+ 1+   Tricep 1+ 1+   BrRad 1+ 1+   Knee 2+ 1+   Ankle 0 0    Pathological Reflexes: Babinski: mute response bilaterally Hoffman: absent bilaterally Troemner: absent bilaterally Sensation: Pinprick: Diminished in bilateral lower extremities, mostly length dependent, but maybe more reduced on lateral aspect of left lower leg Vibration: Absent in bilateral great toes, diminished at bilateral ankles, fully present at bilateral knees Proprioception: Intact in bilateral great toes. Coordination: Intact finger-to- nose-finger bilaterally. Romberg with mild sway. Gait: Able to rise from chair with arms crossed unassisted. Normal, narrow-based gait. Able to tandem walk. Able to walk on toes and heels.  Lab and Test Review: Internal labs: 01/22/21: BMP and LFTs unremarkable  External labs: Prolactin (12/27/21): 0.9 (low) TSH (12/27/21): 0.72 A1c (12/27/20): 5.9 Vit D (06/27/20): 51  Imaging: MRI lumbar spine wo contrast (09/30/12 - outside imaging):    ASSESSMENT: Adam [name] is a 67 y.o. male who presents for evaluation of imbalance and numbness and tingling in bilateral legs. He has a relevant medical history of HTN, HLD, pituitary microadenoma c/b hypothyroidism, degenerative disc disease. His neurological examination is pertinent for diminished sensation in bilateral lower extremities (?L > R) and hyporeflexia (?L >R). Available diagnostic data is significant for HbA1c of 5.9 and MRI lumbar spine from 2014 showing degenerative disc disease without significant stenosis. 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 There may be  some mild asymmetry, raising the question of an overlapping left sided radiculopathy as well (?left L5-S1). His only known risk factor for neuropathy is mild pre-diabetes. I will send labs for treatable causes and get an EMG to further characterize.  PLAN: -Blood work: B12, IFE, HbA1c -EMG: PN (L > R) protocol -Recommend Lidocaine  cream PRN for tingling -Recommend Alpha lipoic acid 600 mg once or twice daily for nerve health -Continue gabapentin 300 mg BID -Discussed PT, will defer for now.  -Return to clinic in 3 months  The impression above as well as the plan as outlined below were extensively discussed with the patient (in the company of wife) who voiced understanding. All questions were answered to their satisfaction.  The patient was counseled on pertinent fall precautions per the printed material provided today, and as noted under the "Patient Instructions" section below.  When available, results of the above investigations and possible further recommendations will be communicated to the patient via telephone/MyChart. Patient to call office if not contacted after expected testing turnaround time.   Total time spent reviewing records, interview, history/exam, documentation, and coordination of care on day of encounter:  60 min   Thank you for allowing me to participate in patient's care.  If I can answer any additional questions, I would be pleased to do so.  Kai Levins, MD   CC: Ronita Hipps, MD [place] [phone]  CC: Referring provider: Ronita Hipps, MD [address] [place] [address],

## 2022-01-22 ENCOUNTER — Other Ambulatory Visit: Payer: PPO

## 2022-01-22 ENCOUNTER — Encounter: Payer: Self-pay | Admitting: Neurology

## 2022-01-22 ENCOUNTER — Ambulatory Visit (INDEPENDENT_AMBULATORY_CARE_PROVIDER_SITE_OTHER): Payer: PPO | Admitting: Neurology

## 2022-01-22 VITALS — BP 150/79 | HR 64 | Wt 249.4 lb

## 2022-01-22 DIAGNOSIS — R200 Anesthesia of skin: Secondary | ICD-10-CM | POA: Diagnosis not present

## 2022-01-22 DIAGNOSIS — R209 Unspecified disturbances of skin sensation: Secondary | ICD-10-CM

## 2022-01-22 DIAGNOSIS — R2689 Other abnormalities of gait and mobility: Secondary | ICD-10-CM

## 2022-01-22 DIAGNOSIS — R7303 Prediabetes: Secondary | ICD-10-CM | POA: Diagnosis not present

## 2022-01-22 DIAGNOSIS — R202 Paresthesia of skin: Secondary | ICD-10-CM

## 2022-01-22 NOTE — Patient Instructions (Addendum)
You can try Lidocaine cream as needed. Apply wear you have pain, tingling, or burning. Wear gloves to prevent your hands being numb. This can be bought over the counter at any drug store or online.  Alpha lipoic acid '600mg'$  daily: Has some research data but actual dose not well established as they used IV in the clinical research trials. No major side effects other than <1% of people report upset stomach. This can be taken twice per day ('1200mg'$  daily) if no relief obtained.  I would like to get blood work today to look for treatable causes of neuropathy.  I would like to get a muscle and nerve test called an EMG (see more information below).  I will be in touch when I have your results.  I would like to see you back in clinic in about 3 months.  The physicians and staff at [facility] are committed to providing excellent care. You may receive a survey requesting feedback about your experience at our office. We strive to receive "very good" responses to the survey questions. If you feel that your experience would prevent you from giving the office a "very good " response, please contact our office to try to remedy the situation. We may be reached at [phone]. Thank you for taking the time out of your busy day to complete the survey.  Kai Levins, MD [facility]  ELECTROMYOGRAM AND NERVE CONDUCTION STUDIES (EMG/NCS) INSTRUCTIONS  How to Prepare The neurologist conducting the EMG will need to know if you have certain medical conditions. Tell the neurologist and other EMG lab personnel if you: Have a pacemaker or any other electrical medical device Take blood-thinning medications Have hemophilia, a blood-clotting disorder that causes prolonged bleeding Bathing Take a shower or bath shortly before your exam in order to remove oils from your skin. Don't apply lotions or creams before the exam.  What to Expect You'll likely be asked to change into a hospital gown for the procedure  and lie down on an examination table. The following explanations can help you understand what will happen during the exam.  Electrodes. The neurologist or a technician places surface electrodes at various locations on your skin depending on where you're experiencing symptoms. Or the neurologist may insert needle electrodes at different sites depending on your symptoms.  Sensations. The electrodes will at times transmit a tiny electrical current that you may feel as a twinge or spasm. The needle electrode may cause discomfort or pain that usually ends shortly after the needle is removed. If you are concerned about discomfort or pain, you may want to talk to the neurologist about taking a short break during the exam.  Instructions. During the needle EMG, the neurologist will assess whether there is any spontaneous electrical activity when the muscle is at rest - activity that isn't present in healthy muscle tissue - and the degree of activity when you slightly contract the muscle.  He or she will give you instructions on resting and contracting a muscle at appropriate times. Depending on what muscles and nerves the neurologist is examining, he or she may ask you to change positions during the exam.  After your EMG You may experience some temporary, minor bruising where the needle electrode was inserted into your muscle. This bruising should fade within several days. If it persists, contact your primary care doctor.   Preventing Falls at [facility] are common, often dreaded events in the lives of older people. Aside from the obvious injuries  and even death that may result, fall can cause wide-ranging consequences including loss of independence, mental decline, decreased activity and mobility. Younger people are also at risk of falling, especially those with chronic illnesses and fatigue.  Ways to reduce risk for falling Examine diet and medications. Warm foods and alcohol dilate blood vessels, which can  lead to dizziness when standing. Sleep aids, antidepressants and pain medications can also increase the likelihood of a fall.  Get a vision exam. Poor vision, cataracts and glaucoma increase the chances of falling.  Check foot gear. Shoes should fit snugly and have a sturdy, nonskid sole and a broad, low heel  Participate in a physician-approved exercise program to build and maintain muscle strength and improve balance and coordination. Programs that use ankle weights or stretch bands are excellent for muscle-strengthening. Water aerobics programs and low-impact Tai Chi programs have also been shown to improve balance and coordination.  Increase vitamin D intake. Vitamin D improves muscle strength and increases the amount of calcium the body is able to absorb and deposit in bones.  How to prevent falls from common hazards Floors - Remove all loose wires, cords, and throw rugs. Minimize clutter. Make sure rugs are anchored and smooth. Keep furniture in its usual place.  Chairs -- Use chairs with straight backs, armrests and firm seats. Add firm cushions to existing pieces to add height.  Bathroom - Install grab bars and non-skid tape in the tub or shower. Use a bathtub transfer bench or a shower chair with a back support Use an elevated toilet seat and/or safety rails to assist standing from a low surface. Do not use towel racks or bathroom tissue holders to help you stand.  Lighting - Make sure halls, stairways, and entrances are well-lit. Install a night light in your bathroom or hallway. Make sure there is a light switch at the top and bottom of the staircase. Turn lights on if you get up in the middle of the night. Make sure lamps or light switches are within reach of the bed if you have to get up during the night.  Kitchen - Install non-skid rubber mats near the sink and stove. Clean spills immediately. Store frequently used utensils, pots, pans between waist and eye level. This helps prevent  reaching and bending. Sit when getting things out of lower cupboards.  Living room/ Bedrooms - Place furniture with wide spaces in between, giving enough room to move around. Establish a route through the living room that gives you something to hold onto as you walk.  Stairs - Make sure treads, rails, and rugs are secure. Install a rail on both sides of the stairs. If stairs are a threat, it might be helpful to arrange most of your activities on the lower level to reduce the number of times you must climb the stairs.  Entrances and doorways - Install metal handles on the walls adjacent to the doorknobs of all doors to make it more secure as you travel through the doorway.  Tips for maintaining balance Keep at least one hand free at all times. Try using a backpack or fanny pack to hold things rather than carrying them in your hands. Never carry objects in both hands when walking as this interferes with keeping your balance.  Attempt to swing both arms from front to back while walking. This might require a conscious effort if Parkinson's disease has diminished your movement. It will, however, help you to maintain balance and posture, and reduce fatigue.  Consciously lift your feet off of the ground when walking. Shuffling and dragging of the feet is a common culprit in losing your balance.  When trying to navigate turns, use a "U" technique of facing forward and making a wide turn, rather than pivoting sharply.  Try to stand with your feet shoulder-length apart. When your feet are close together for any length of time, you increase your risk of losing your balance and falling.  Do one thing at a time. Don't try to walk and accomplish another task, such as reading or looking around. The decrease in your automatic reflexes complicates motor function, so the less distraction, the better.  Do not wear rubber or gripping soled shoes, they might "catch" on the floor and cause tripping.  Move slowly  when changing positions. Use deliberate, concentrated movements and, if needed, use a grab bar or walking aid. Count 15 seconds between each movement. For example, when rising from a seated position, wait 15 seconds after standing to begin walking.  If balance is a continuous problem, you might want to consider a walking aid such as a cane, walking stick, or walker. Once you've mastered walking with help, you might be ready to try it on your own again.

## 2022-01-23 LAB — VITAMIN B12: Vitamin B-12: 1232 pg/mL — ABNORMAL HIGH (ref 211–911)

## 2022-01-23 LAB — HEMOGLOBIN A1C: Hgb A1c MFr Bld: 6.5 % (ref 4.6–6.5)

## 2022-01-24 DIAGNOSIS — E349 Endocrine disorder, unspecified: Secondary | ICD-10-CM | POA: Diagnosis not present

## 2022-01-29 LAB — IMMUNOFIXATION ELECTROPHORESIS
IgG (Immunoglobin G), Serum: 1213 mg/dL (ref 600–1540)
IgM, Serum: 89 mg/dL (ref 50–300)
Immunoglobulin A: 191 mg/dL (ref 70–320)

## 2022-02-05 DIAGNOSIS — E291 Testicular hypofunction: Secondary | ICD-10-CM | POA: Diagnosis not present

## 2022-02-18 DIAGNOSIS — E291 Testicular hypofunction: Secondary | ICD-10-CM | POA: Diagnosis not present

## 2022-03-03 DIAGNOSIS — E291 Testicular hypofunction: Secondary | ICD-10-CM | POA: Diagnosis not present

## 2022-03-10 ENCOUNTER — Ambulatory Visit: Payer: PPO | Admitting: Neurology

## 2022-03-10 ENCOUNTER — Telehealth: Payer: Self-pay | Admitting: Neurology

## 2022-03-10 DIAGNOSIS — M5417 Radiculopathy, lumbosacral region: Secondary | ICD-10-CM

## 2022-03-10 DIAGNOSIS — R209 Unspecified disturbances of skin sensation: Secondary | ICD-10-CM

## 2022-03-10 DIAGNOSIS — R200 Anesthesia of skin: Secondary | ICD-10-CM

## 2022-03-10 DIAGNOSIS — R7303 Prediabetes: Secondary | ICD-10-CM

## 2022-03-10 DIAGNOSIS — G629 Polyneuropathy, unspecified: Secondary | ICD-10-CM

## 2022-03-10 DIAGNOSIS — R2689 Other abnormalities of gait and mobility: Secondary | ICD-10-CM

## 2022-03-10 NOTE — Procedures (Signed)
[facility]  [place], [address]  [place], [place] [address] Tel: [phone] Fax: [phone] Test Date:  03/10/2022  Patient: Drilon Ussery DOB: 1955-08-21 Physician: Kai Levins, MD  Sex: Male Height: '6\' 1"'$  Ref Phys: Kai Levins, MD  ID#: [id number]   Technician:    History: This is a 67 year old male with numbness and tingling in legs and gait imbalance.  NCV & EMG Findings: Extensive electrodiagnostic evaluation of the left lower limb with additional nerve conduction studies in the left upper limb and right lower limb shows: Bilateral sural and superficial peroneal/fibular sensory responses are absent. Right median, ulnar, and radial sensory responses are within normal limits. Bilateral tibial (AH) motor response shows reduced amplitude (L 1.77, R 2.4 mV) and the left tibial (AH) shows a prolonged distal onset latency (6.9 ms). Left peroneal/fibular (EDB) motor response shows prolonged distal onset latency (8.4 ms) and reduced amplitude (0.66 mV). Right peroneal/fibular (EDB), left peroneal/fibular (TA), left median (APB), and left ulnar (ADM) motor responses are within normal limits. Left H reflex is absent. Chronic motor axon loss changes without accompanying active denervation changes are seen in the left tibialis anterior, medial head of gastrocnemius, flexor digitorum longus, gluteus medius, and L5 paraspinal muscles.  Impression: This is an abnormal study. 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 The residuals of an old intraspinal canal lesion (ie: motor radiculopathy) at the left L5 root, mild to moderate in degree electrically.    ___________________________ Kai Levins, MD    Nerve Conduction Studies Motor Nerve Results    Latency Amplitude F-Lat Segment Distance CV Comment  Site (ms) Norm (mV) Norm (ms)  (cm) (m/s) Norm   Left  Fibular (EDB) Motor  Ankle *8.4  < 6.0 *0.66  > 2.5        Bel fib head *NR - *NR -  Bel fib head-Ankle - *NR  > 40   Pop fossa *NR - *NR -  Pop fossa-Bel fib head - *NR -   Right Fibular (EDB) Motor  Ankle 5.9  < 6.0 4.1  > 2.5        Left Fibular (TA) Motor  Fib head 2.9  < 4.5 3.3  > 3.0        Pop fossa 5.0  < 6.7 2.8 -  Pop fossa-Fib head 10 48  > 40   Left Median (APB) Motor  Wrist 3.4  < 4.0 8.2  > 5.0        Elbow 9.0 - 7.6 -  Elbow-Wrist 29 52  > 50   Left Tibial (AH) Motor  Ankle *6.9  < 6.0 *1.77  > 4.0        Knee *NR - *NR -  Knee-Ankle - *NR  > 40   Right Tibial (AH) Motor  Ankle 5.9  < 6.0 *2.4  > 4.0        Left Ulnar (ADM) Motor  Wrist 2.4  < 3.1 9.9  > 7.0        Bel elbow 7.5 - 8.8 -  Bel elbow-Wrist 26 51  > 50   Ab elbow 9.5 - 7.6 -  Ab elbow-Bel elbow 10 50 -    Sensory Sites    Neg Peak Lat Amplitude (O-P) Segment Distance Velocity Comment  Site (ms) Norm (V) Norm  (cm) (ms)   Left Median Sensory  Wrist-Dig II 3.6  <  3.8 15  > 10 Wrist-Dig II 13    Left Radial Sensory  Forearm-Wrist 2.6  < 2.8 14  > 10 Forearm-Wrist 10    Left Superficial Fibular Sensory  14 cm-Ankle *NR  < 4.6 *NR  > 3 14 cm-Ankle 14    Right Superficial Fibular Sensory  14 cm-Ankle *NR  < 4.6 *NR  > 3 14 cm-Ankle 14    Left Sural Sensory  Calf-Lat mall *NR  < 4.6 *NR  > 3 Calf-Lat mall 14    Right Sural Sensory  Calf-Lat mall *NR  < 4.6 *NR  > 3 Calf-Lat mall 14    Left Ulnar Sensory  Wrist-Dig V 3.2  < 3.2 16  > 5 Wrist-Dig V 11     H-Reflex Results    M-Lat H Lat H Neg Amp H-M Lat  Site (ms) (ms) Norm (mV) (ms)  Left Tibial H-Reflex  Pop fossa 7.1 -  < 35.0 - -   Electromyography   Side Muscle Ins.Act Fibs Fasc Recrt Amp Dur Poly Activation Comment  Left Tib ant Nml Nml Nml *1- *1+ *1+ *1+ Nml N/A  Left Gastroc MH Nml Nml Nml *1- *1+ *1+ *1+ Nml N/A  Left FDL Nml Nml Nml *2- *1+ *1+ *1+ Nml N/A  Left Rectus fem Nml Nml Nml Nml Nml Nml Nml Nml N/A  Left Biceps fem SH  Nml Nml Nml Nml Nml Nml Nml Nml N/A  Left Gluteus med Nml Nml Nml *1- Nml *1+ Nml Nml N/A  Left L5 PSP Nml Nml Nml *1- *1+ *1+ *1+ Nml N/A      Waveforms:  Motor                  Sensory                  H-Reflex

## 2022-03-10 NOTE — Telephone Encounter (Signed)
Discussed the results of patient's EMG after the procedure today. It showed evidence of a large fiber polyneuropathy and residuals of an old left L5 radiculopathy. Neuropathy is likely 2/2 diabetes.  All questions were answered.  Kai Levins, MD [facility]

## 2022-03-14 DIAGNOSIS — E291 Testicular hypofunction: Secondary | ICD-10-CM | POA: Diagnosis not present

## 2022-03-26 DIAGNOSIS — E291 Testicular hypofunction: Secondary | ICD-10-CM | POA: Diagnosis not present

## 2022-03-31 DIAGNOSIS — J069 Acute upper respiratory infection, unspecified: Secondary | ICD-10-CM | POA: Diagnosis not present

## 2022-04-07 DIAGNOSIS — E291 Testicular hypofunction: Secondary | ICD-10-CM | POA: Diagnosis not present

## 2022-04-17 NOTE — Progress Notes (Signed)
I saw Adam [name] in neurology clinic on 05/01/22 in follow up for imbalance and neuropathy.  HPI: Adam [name] is a 67 y.o. year old male with a history of HTN, HLD, DM, pituitary microadenoma c/b hypothyroidism, degenerative disc disease  who we last saw on 01/22/22.  To briefly review: Patient was walking on an outside track which he does multiple times per week. He started feeling like his feet were going to "fly out from under" him. This started around the summer of 2023. He stopped walking in 10/2021.    On a shining floor, patient felt like he was going to fall. He felt like he had to shuffle his feet in this situation. He does not have this problem when walking on grass. He has not had any recent falls.   He had back injury around 08/2005. He was weed eating a bank. He had pain in his leg radiating from his back. He was given a muscle relaxer.  His pain went away but he was left with numbness in left leg below the calf. He was told a nerve was dead. He has tingling in the left foot and more recently now in the right leg. He has occasionally cramping in the left calf, but this has improved compared to prior. He currently has no significant back pain. He has never an EMG.    Patient takes gabapentin 300 mg BID for "cough neuropathy". This does not help the symptoms in his feet. Patient is on B12 and vit D supplementation. He did this on his own, not because he was ever deficient.   The patient does not report symptoms referable to autonomic dysfunction including impaired sweating, heat or cold intolerance, excessive mucosal dryness, gastroparetic early satiety, postprandial abdominal bloating, constipation, bowel or bladder dyscontrol, or syncope/presyncope/orthostatic intolerance (only rarely).   He does not report any constitutional symptoms like fever, night sweats, anorexia or unintentional weight loss.   EtOH use: none  Restrictive diet? no Family history of  neuropathy/myopathy/NM disease? No   He has not had PT recently.  Most recent Assessment and Plan (01/22/22): His neurological examination is pertinent for diminished sensation in bilateral lower extremities (?L > R) and hyporeflexia (?L >R). Available diagnostic data is significant for HbA1c of 5.9 and MRI lumbar spine from 2014 showing degenerative disc disease without significant stenosis. 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 There may be some mild asymmetry, raising the question of an overlapping left sided radiculopathy as well (?left L5-S1). His only known risk factor for neuropathy is mild pre-diabetes. I will send labs for treatable causes and get an EMG to further characterize.   PLAN: -Blood work: B12, IFE, HbA1c -EMG: PN (L > R) protocol -Recommend Lidocaine cream PRN for tingling -Recommend Alpha lipoic acid 600 mg once or twice daily for nerve health -Continue gabapentin 300 mg BID -Discussed PT, will defer for now.  Since their last visit: Labs were significant for HbA1c of 6.5. EMG on 03/10/22 showed evidence of an axonal neuropathy, moderate in degree electrically and the residuals of an old left L5 radiculopathy, mild to moderate in degree electrically.  Patient thinks his symptoms may have improved compared to last visit. He has not had any falls. Patient continues to take gabapentin 300 mg BID. He forgot about my recommendation to take alpha lipoic acid and has not looked into this.  Overall, patient feels well with no new complaints today.   MEDICATIONS:  Outpatient Encounter Medications  as of 05/01/2022  Medication Sig   aspirin EC 81 MG tablet Take 81 mg by mouth daily.   cabergoline (DOSTINEX) 0.5 MG tablet Take 0.5 tablets by mouth 2 (two) times a week. 1/2 pill one time a week   gabapentin (NEURONTIN) 300 MG capsule Take 300 mg by mouth 2 (two) times daily.   levocetirizine (XYZAL) 5 MG tablet Take 5 mg by mouth every  evening.   levothyroxine (SYNTHROID) 88 MCG tablet Take 88 mcg by mouth daily.   meloxicam (MOBIC) 15 MG tablet Take 15 mg by mouth daily.   nitroGLYCERIN (NITROSTAT) 0.4 MG SL tablet Place 0.4 mg under the tongue every 5 (five) minutes as needed for chest pain.   Omega-3 Fatty Acids (FISH OIL) 1000 MG CAPS Take 1,000 mg by mouth daily.   OVER THE COUNTER MEDICATION Take 3 capsules by mouth daily. Power c supplement   QUERCETIN PO Take 500 mg by mouth daily.   rosuvastatin (CRESTOR) 10 MG tablet Take 10 mg by mouth at bedtime.   spironolactone (ALDACTONE) 25 MG tablet Take 25 mg by mouth daily.   SYMBICORT 160-4.5 MCG/ACT inhaler Inhale 1-2 puffs into the lungs as needed for shortness of breath or wheezing.   telmisartan (MICARDIS) 80 MG tablet Take 80 mg by mouth daily.   testosterone cypionate (DEPOTESTOSTERONE CYPIONATE) 200 MG/ML injection Inject 0.6 mLs into the muscle as directed. 0.596ml every 12 days   zolpidem (AMBIEN) 10 MG tablet Take 10 mg by mouth at bedtime as needed for sleep.   Cholecalciferol (EQL VITAMIN D3) 25 MCG (1000 UT) capsule Take 1,000 Units by mouth daily. (Patient not taking: Reported on 05/01/2022)   dexlansoprazole (DEXILANT) 60 MG capsule Take 60 mg by mouth daily. (Patient not taking: Reported on 01/22/2022)   fluticasone (FLONASE) 50 MCG/ACT nasal spray Place 1 spray into both nostrils as needed for rhinitis or allergies. (Patient not taking: Reported on 01/22/2022)   loratadine (CLARITIN) 10 MG tablet Take 1 tablet (10 mg total) by mouth daily. (Patient not taking: Reported on 01/22/2022)   pantoprazole (PROTONIX) 40 MG tablet Take 40 mg by mouth daily.   vitamin B-12 (CYANOCOBALAMIN) 1000 MCG tablet Take 1,000 mcg by mouth daily. (Patient not taking: Reported on 05/01/2022)   No facility-administered encounter medications on file as of 05/01/2022.    PAST MEDICAL HISTORY: Past Medical History:  Diagnosis Date   Abnormal cardiac CT angiography    Abnormal glucose  07/20/2015   Acid reflux    Acquired hypothyroidism 07/20/2015   Angina pectoris 03/08/2020   CAD (coronary artery disease) 04/12/2020   Cervical radiculopathy    Chest pain of uncertain etiology 05/15/2020   DDD (degenerative disc disease), cervical    Erectile dysfunction 06/25/2018   Gynecomastia, male 11/23/2017   Hemoptysis    Hyperprolactinemia 07/20/2015   Hypertension    Hypogonadism in male 07/20/2015   Mixed dyslipidemia 03/08/2020   Obesity (BMI 30.0-34.9) 03/08/2020   Pituitary microadenoma with hyperprolactinemia    Progressive angina 05/15/2020   S/P cardiac cath with coronary bridging but very minimal CAD -not cause of chest pain 05/15/2020    PAST SURGICAL HISTORY: Past Surgical History:  Procedure Laterality Date   CATARACT EXTRACTION     LEFT HEART CATH AND CORONARY ANGIOGRAPHY N/A 05/15/2020   Procedure: LEFT HEART CATH AND CORONARY ANGIOGRAPHY;  Surgeon: Marykay LexHarding, David W, MD;  Location: [facility];  Service: Cardiovascular;  Laterality: N/A;   RIGHT HEART CATH  2006   ROTATOR CUFF  REPAIR Right 04/2016    ALLERGIES: No Known Allergies  FAMILY HISTORY: Family History  Problem Relation Age of Onset   Hypertension Mother    Ovarian cancer Mother    Hypertension Father    Pancreatic cancer Father    Ovarian cancer Maternal Grandmother    Heart disease Maternal Grandfather    Hypertension Sister    Hypertension Brother     SOCIAL HISTORY: Social History   Tobacco Use   Smoking status: Never   Smokeless tobacco: Never  Vaping Use   Vaping Use: Never used  Substance Use Topics   Alcohol use: Never   Drug use: Never   Social History   Social History Narrative   Are you right handed or left handed? Right    Are you currently employed ? no   What is your current occupation? retired   Do you live at home alone?no   Who lives with you? Wife and dog   What type of home do you live in: 1 story or 2 story? One    Caffeine 2 soda a day    Objective:   Vital Signs:  BP (!) 155/75   Pulse 66   Ht 6\' 1"  (1.854 m)   Wt 240 lb (108.9 kg)   SpO2 97%   BMI 31.66 kg/m   General: General appearance: Awake and alert. No distress. Cooperative with exam.  Skin: No obvious rash or jaundice. HEENT: Atraumatic. Anicteric. Lungs: Non-labored breathing on room air  Extremities: No edema. No obvious deformity.   Neurological: Mental Status: Alert. Speech fluent. No pseudobulbar affect Cranial Nerves: CNII: No RAPD. Visual fields intact. CNIII, IV, VI: PERRL. No nystagmus. EOMI. CN V: Facial sensation intact bilaterally to fine touch. CN VII: Facial muscles symmetric and strong. No ptosis at rest. CN VIII: Hears finger rub well bilaterally. CN IX: No hypophonia. CN X: Palate elevates symmetrically. CN XI: Full strength shoulder shrug bilaterally. CN XII: Tongue protrusion full and midline. No atrophy or fasciculations. No significant dysarthria Motor: Tone is normal. Strength 5/5 in bilateral upper and lower extremities.  Reflexes:  Right Left  Bicep 1+ 1+  Tricep 1+ 1+  BrRad 1+ 1+  Knee 1+ 1+  Ankle 0 0   Sensation: Pinprick: Diminished in bilateral lower extremities Vibration: Absent in bilateral great toes Proprioception: Intact in bilateral great toes Coordination: Intact finger-to- nose-finger bilaterally. Gait: Able to rise from chair with arms crossed unassisted. Normal, narrow-based gait.   Lab and Test Review: New results: 01/22/22: B12: 1232 HbA1c: 6.5 IFE: no M protein  EMG (03/10/22): NCV & EMG Findings: Extensive electrodiagnostic evaluation of the left lower limb with additional nerve conduction studies in the left upper limb and right lower limb shows: Bilateral sural and superficial peroneal/fibular sensory responses are absent. Right median, ulnar, and radial sensory responses are within normal limits. Bilateral tibial (AH) motor response shows reduced amplitude (L 1.77, R 2.4 mV) and the left tibial (AH)  shows a prolonged distal onset latency (6.9 ms). Left peroneal/fibular (EDB) motor response shows prolonged distal onset latency (8.4 ms) and reduced amplitude (0.66 mV). Right peroneal/fibular (EDB), left peroneal/fibular (TA), left median (APB), and left ulnar (ADM) motor responses are within normal limits. Left H reflex is absent. Chronic motor axon loss changes without accompanying active denervation changes are seen in the left tibialis anterior, medial head of gastrocnemius, flexor digitorum longus, gluteus medius, and L5 paraspinal muscles.   Impression: This is an abnormal study. The findings are most consistent  with the following: Evidence of a large fiber sensorimotor polyneuropathy, axon loss in type, moderate in degree electrically. The residuals of an old intraspinal canal lesion (ie: motor radiculopathy) at the left L5 root, mild to moderate in degree electrically.  Previously reviewed results: 01/22/21: BMP and LFTs unremarkable   External labs: Prolactin (12/27/21): 0.9 (low) TSH (12/27/21): 0.72 A1c (12/27/20): 5.9 Vit D (06/27/20): 51   Imaging: MRI lumbar spine wo contrast (09/30/12 - outside imaging):       ASSESSMENT: This is Adam [name], a 67 y.o. male with:  Distal symmetric large fiber polyneuropathy - Moderate electrically on EMG from 03/10/22. His known risk factor is diabetes (most recent A1c was 6.5) Lumbar radiculopathy - residuals of an left L5 radiculopathy on EMG from 03/10/22  Plan: -Continue gabapentin 300 mg BID -Alpha lipoic acid 600 mg once or twice daily -Discussed DM and importance of controlling blood sugars to prevent neuropathy from worsening  Return to clinic in 1 year   Jacquelyne Balint, MD

## 2022-04-18 DIAGNOSIS — E291 Testicular hypofunction: Secondary | ICD-10-CM | POA: Diagnosis not present

## 2022-04-30 DIAGNOSIS — E291 Testicular hypofunction: Secondary | ICD-10-CM | POA: Diagnosis not present

## 2022-05-01 ENCOUNTER — Ambulatory Visit: Payer: PPO | Admitting: Neurology

## 2022-05-01 ENCOUNTER — Encounter: Payer: Self-pay | Admitting: Neurology

## 2022-05-01 VITALS — BP 155/75 | HR 66 | Ht 73.0 in | Wt 240.0 lb

## 2022-05-01 DIAGNOSIS — R7303 Prediabetes: Secondary | ICD-10-CM

## 2022-05-01 DIAGNOSIS — M5417 Radiculopathy, lumbosacral region: Secondary | ICD-10-CM | POA: Diagnosis not present

## 2022-05-01 DIAGNOSIS — G629 Polyneuropathy, unspecified: Secondary | ICD-10-CM | POA: Diagnosis not present

## 2022-05-01 DIAGNOSIS — R2689 Other abnormalities of gait and mobility: Secondary | ICD-10-CM

## 2022-05-01 DIAGNOSIS — R209 Unspecified disturbances of skin sensation: Secondary | ICD-10-CM | POA: Diagnosis not present

## 2022-05-01 DIAGNOSIS — R202 Paresthesia of skin: Secondary | ICD-10-CM | POA: Diagnosis not present

## 2022-05-01 DIAGNOSIS — R200 Anesthesia of skin: Secondary | ICD-10-CM

## 2022-05-01 NOTE — Patient Instructions (Signed)
Alpha lipoic acid  daily has some research data suggesting it helps with nerve health. No major side effects other than <1% of people report upset stomach. This can be taken twice per day (  daily) if no relief obtained. You can buy this over the counter or online.  Continue gabapentin 300 mg twice daily  I would like to see you back in about 1 year or sooner if needed.  The physicians and staff at [facility] are committed to providing excellent care. You may receive a survey requesting feedback about your experience at our office. We strive to receive "very good" responses to the survey questions. If you feel that your experience would prevent you from giving the office a "very good " response, please contact our office to try to remedy the situation. We may be reached at [phone]. Thank you for taking the time out of your busy day to complete the survey.  Jacquelyne Balint, MD [facility]

## 2022-05-05 DIAGNOSIS — E291 Testicular hypofunction: Secondary | ICD-10-CM | POA: Diagnosis not present

## 2022-05-05 DIAGNOSIS — Z9181 History of falling: Secondary | ICD-10-CM | POA: Diagnosis not present

## 2022-05-05 DIAGNOSIS — Z6832 Body mass index (BMI) 32.0-32.9, adult: Secondary | ICD-10-CM | POA: Diagnosis not present

## 2022-05-05 DIAGNOSIS — F5104 Psychophysiologic insomnia: Secondary | ICD-10-CM | POA: Diagnosis not present

## 2022-05-05 DIAGNOSIS — Z1339 Encounter for screening examination for other mental health and behavioral disorders: Secondary | ICD-10-CM | POA: Diagnosis not present

## 2022-05-05 DIAGNOSIS — I10 Essential (primary) hypertension: Secondary | ICD-10-CM | POA: Diagnosis not present

## 2022-05-05 DIAGNOSIS — E782 Mixed hyperlipidemia: Secondary | ICD-10-CM | POA: Diagnosis not present

## 2022-05-05 DIAGNOSIS — Z1331 Encounter for screening for depression: Secondary | ICD-10-CM | POA: Diagnosis not present

## 2022-05-05 DIAGNOSIS — Z0000 Encounter for general adult medical examination without abnormal findings: Secondary | ICD-10-CM | POA: Diagnosis not present

## 2022-05-05 DIAGNOSIS — Z79899 Other long term (current) drug therapy: Secondary | ICD-10-CM | POA: Diagnosis not present

## 2022-05-05 DIAGNOSIS — M1990 Unspecified osteoarthritis, unspecified site: Secondary | ICD-10-CM | POA: Diagnosis not present

## 2022-05-05 DIAGNOSIS — K219 Gastro-esophageal reflux disease without esophagitis: Secondary | ICD-10-CM | POA: Diagnosis not present

## 2022-05-12 DIAGNOSIS — E291 Testicular hypofunction: Secondary | ICD-10-CM | POA: Diagnosis not present

## 2022-05-23 DIAGNOSIS — E349 Endocrine disorder, unspecified: Secondary | ICD-10-CM | POA: Diagnosis not present

## 2022-06-04 DIAGNOSIS — E291 Testicular hypofunction: Secondary | ICD-10-CM | POA: Diagnosis not present

## 2022-06-16 DIAGNOSIS — E349 Endocrine disorder, unspecified: Secondary | ICD-10-CM | POA: Diagnosis not present

## 2022-06-27 DIAGNOSIS — E291 Testicular hypofunction: Secondary | ICD-10-CM | POA: Diagnosis not present

## 2022-07-09 DIAGNOSIS — E349 Endocrine disorder, unspecified: Secondary | ICD-10-CM | POA: Diagnosis not present

## 2022-07-21 DIAGNOSIS — E349 Endocrine disorder, unspecified: Secondary | ICD-10-CM | POA: Diagnosis not present

## 2022-08-01 DIAGNOSIS — E349 Endocrine disorder, unspecified: Secondary | ICD-10-CM | POA: Diagnosis not present

## 2022-08-13 DIAGNOSIS — E349 Endocrine disorder, unspecified: Secondary | ICD-10-CM | POA: Diagnosis not present

## 2022-08-18 DIAGNOSIS — U071 COVID-19: Secondary | ICD-10-CM | POA: Diagnosis not present

## 2022-08-18 DIAGNOSIS — R059 Cough, unspecified: Secondary | ICD-10-CM | POA: Diagnosis not present

## 2022-08-18 DIAGNOSIS — I959 Hypotension, unspecified: Secondary | ICD-10-CM | POA: Diagnosis not present

## 2022-08-18 DIAGNOSIS — R1111 Vomiting without nausea: Secondary | ICD-10-CM | POA: Diagnosis not present

## 2022-08-18 DIAGNOSIS — R55 Syncope and collapse: Secondary | ICD-10-CM | POA: Diagnosis not present

## 2022-08-18 DIAGNOSIS — R001 Bradycardia, unspecified: Secondary | ICD-10-CM | POA: Diagnosis not present

## 2022-08-18 DIAGNOSIS — I10 Essential (primary) hypertension: Secondary | ICD-10-CM | POA: Diagnosis not present

## 2022-08-25 DIAGNOSIS — R051 Acute cough: Secondary | ICD-10-CM | POA: Diagnosis not present

## 2022-08-25 DIAGNOSIS — J00 Acute nasopharyngitis [common cold]: Secondary | ICD-10-CM | POA: Diagnosis not present

## 2022-08-25 DIAGNOSIS — J0100 Acute maxillary sinusitis, unspecified: Secondary | ICD-10-CM | POA: Diagnosis not present

## 2022-08-26 DIAGNOSIS — E291 Testicular hypofunction: Secondary | ICD-10-CM | POA: Diagnosis not present

## 2022-09-09 DIAGNOSIS — E291 Testicular hypofunction: Secondary | ICD-10-CM | POA: Diagnosis not present

## 2022-09-22 DIAGNOSIS — E349 Endocrine disorder, unspecified: Secondary | ICD-10-CM | POA: Diagnosis not present

## 2022-09-24 DIAGNOSIS — C44319 Basal cell carcinoma of skin of other parts of face: Secondary | ICD-10-CM | POA: Diagnosis not present

## 2022-09-24 DIAGNOSIS — D485 Neoplasm of uncertain behavior of skin: Secondary | ICD-10-CM | POA: Diagnosis not present

## 2022-09-24 DIAGNOSIS — L814 Other melanin hyperpigmentation: Secondary | ICD-10-CM | POA: Diagnosis not present

## 2022-09-24 DIAGNOSIS — L570 Actinic keratosis: Secondary | ICD-10-CM | POA: Diagnosis not present

## 2022-09-24 DIAGNOSIS — L578 Other skin changes due to chronic exposure to nonionizing radiation: Secondary | ICD-10-CM | POA: Diagnosis not present

## 2022-09-25 DIAGNOSIS — E221 Hyperprolactinemia: Secondary | ICD-10-CM | POA: Diagnosis not present

## 2022-09-25 DIAGNOSIS — R7303 Prediabetes: Secondary | ICD-10-CM | POA: Diagnosis not present

## 2022-09-25 DIAGNOSIS — E039 Hypothyroidism, unspecified: Secondary | ICD-10-CM | POA: Diagnosis not present

## 2022-09-25 DIAGNOSIS — E291 Testicular hypofunction: Secondary | ICD-10-CM | POA: Diagnosis not present

## 2022-10-03 DIAGNOSIS — E349 Endocrine disorder, unspecified: Secondary | ICD-10-CM | POA: Diagnosis not present

## 2022-10-15 DIAGNOSIS — E349 Endocrine disorder, unspecified: Secondary | ICD-10-CM | POA: Diagnosis not present

## 2022-10-16 DIAGNOSIS — C44319 Basal cell carcinoma of skin of other parts of face: Secondary | ICD-10-CM | POA: Diagnosis not present

## 2022-10-27 DIAGNOSIS — E291 Testicular hypofunction: Secondary | ICD-10-CM | POA: Diagnosis not present

## 2022-11-07 DIAGNOSIS — E349 Endocrine disorder, unspecified: Secondary | ICD-10-CM | POA: Diagnosis not present

## 2022-11-19 DIAGNOSIS — Z6832 Body mass index (BMI) 32.0-32.9, adult: Secondary | ICD-10-CM | POA: Diagnosis not present

## 2022-11-19 DIAGNOSIS — R52 Pain, unspecified: Secondary | ICD-10-CM | POA: Diagnosis not present

## 2022-11-19 DIAGNOSIS — E349 Endocrine disorder, unspecified: Secondary | ICD-10-CM | POA: Diagnosis not present

## 2022-11-21 DIAGNOSIS — K640 First degree hemorrhoids: Secondary | ICD-10-CM | POA: Diagnosis not present

## 2022-11-21 DIAGNOSIS — K6289 Other specified diseases of anus and rectum: Secondary | ICD-10-CM | POA: Diagnosis not present

## 2022-11-21 DIAGNOSIS — K5900 Constipation, unspecified: Secondary | ICD-10-CM | POA: Diagnosis not present

## 2022-12-02 DIAGNOSIS — E349 Endocrine disorder, unspecified: Secondary | ICD-10-CM | POA: Diagnosis not present

## 2022-12-16 DIAGNOSIS — E349 Endocrine disorder, unspecified: Secondary | ICD-10-CM | POA: Diagnosis not present

## 2022-12-29 DIAGNOSIS — E039 Hypothyroidism, unspecified: Secondary | ICD-10-CM | POA: Diagnosis not present

## 2022-12-29 DIAGNOSIS — E221 Hyperprolactinemia: Secondary | ICD-10-CM | POA: Diagnosis not present

## 2022-12-29 DIAGNOSIS — Z8616 Personal history of COVID-19: Secondary | ICD-10-CM | POA: Diagnosis not present

## 2022-12-29 DIAGNOSIS — E291 Testicular hypofunction: Secondary | ICD-10-CM | POA: Diagnosis not present

## 2022-12-29 DIAGNOSIS — R7303 Prediabetes: Secondary | ICD-10-CM | POA: Diagnosis not present

## 2022-12-29 NOTE — Progress Notes (Signed)
 Cardiology Office Note:  .   Date:  12/30/2022  ID:  Adam [name], DOB May 25, 1955, MRN [id number] PCP: Adam Ponto, MD  [facility] Providers Cardiologist:  Adam Brothers, MD    History of Present Illness: Adam [name]   Adam [name] is a 67 y.o. male with a past medical history of hypertension, CAD nonobstructive per left heart cath, acid reflux, hypothyroidism, pituitary microadenoma with hyperprolactinemia, dyslipidemia, history of gynecomastia, ED.  05/15/2020 left heart cath angiographically minimal CAD with 2 focal areas of subendocardial bridging in the LAD but no stenosis 06/07/2020 coronary CTA calcium score 149, 64th percentile >> FFR reported flow-limiting lesion in the distal LAD 02/15/2020 MPI No reversible ischemia, small stable fixed perfusion defect, EF 57%  Most recently evaluated by Dr. Tomie [name] on 01/21/2021, he was walking daily approximately 3 miles, he was doing well from a cardiac perspective, advised to follow-up in 6 months.   He presents today with concerns of elevated blood pressure reading.  He has been seeing his PCP for testosterone injections, dose was recently increased, he went in for routine injection and requested that his blood pressure be checked.  It was very elevated and he was started on metoprolol 25 mg daily.  He has been keeping a blood pressure log of his own at home, states his blood pressure has been better controlled. He denies chest pain, palpitations, dyspnea, pnd, orthopnea, n, v, dizziness, syncope, edema, weight gain, or early satiety.   ROS: Review of Systems  All other systems reviewed and are negative.   Studies Reviewed: Adam [name]   EKG Interpretation Date/Time:  Tuesday December 30 2022 11:54:14 EST Ventricular Rate:  56 PR Interval:  172 QRS Duration:  82 QT Interval:  408 QTC Calculation: 393 R Axis:   46  Text Interpretation: Sinus bradycardia Otherwise normal ECG When compared with ECG of 15-May-2020 13:50, No significant change  was found Confirmed by Adam [name] [phone]) on 12/30/2022 11:00:10 AM    Cardiac Studies & Procedures   CARDIAC CATHETERIZATION  CARDIAC CATHETERIZATION 05/15/2020  Narrative  The left ventricular systolic function is normal. The left ventricular ejection fraction is 55-65% by visual estimate.  LV end diastolic pressure is normal.  There is no aortic valve stenosis.  Mostly normal coronaries with a Left Dominant Coronary System  Prox LAD lesion is 5% stenosed. - > This site was considered to be moderate by coronary CTA, but likely because the vessel dives down potentially under the myocardium temporarily.  Mid LAD-1 lesion is 10% stenosed. Mid LAD-2 lesion is 10% stenosed. -2 segments of mild kinking likely the beginning and end of the myocardial segment.  SUMMARY  Angiographically minimal CAD with 2 focal areas of subendocardial bridging in the LAD but no stenoses.  Normal EF with normal EDP.    RECOMMENDATIONS  Consider noncardiac chest pain  For resting chest pain if it is cardiac, could consider vasodilatory agents for possible spasm.    Adam Lemma, MD  Findings Coronary Findings Diagnostic  Dominance: Left  Left Main Vessel was injected. Vessel is normal in caliber and large. Vessel is angiographically normal.  Left Anterior Descending Vessel was injected. Vessel is normal in caliber. Vessel is angiographically normal. Prox LAD lesion is 5% stenosed. The lesion is located at the bend. Vessel appears to dive potentially as a short bridging segment at this location -- site called 50-69% on CTA (NO NOTABLE STENOSIS) Mid LAD-1 lesion is 10% stenosed. Beginning of myocardial bridging Mid LAD-2 lesion  is 10% stenosed. End of myocardial bridging.  First Diagonal Branch Vessel is moderate in size.  Lateral First Diagonal Branch Vessel is small in size.  Third Septal Branch Vessel is small in size.  Left Circumflex Vessel was injected. Vessel is normal  in caliber and large. Vessel is angiographically normal.  First Left Posterolateral Branch Vessel is small in size.  Second Left Posterolateral Branch Vessel is small in size.  Right Coronary Artery Vessel was injected. Vessel is normal in caliber and small. Vessel is angiographically normal.  Right Ventricular Branch Vessel is small in size.  Intervention  No interventions have been documented.   STRESS TESTS  MYOCARDIAL PERFUSION IMAGING 02/15/2020     CT SCANS  CT CORONARY FRACTIONAL FLOW RESERVE DATA PREP 03/23/2020  Narrative EXAM: FFRCT ANALYSIS  FINDINGS: FFRct analysis was performed on the original cardiac CT angiogram dataset. Diagrammatic representation of the FFRct analysis is provided in a separate PDF document in PACS. This dictation was created using the PDF document and an interactive 3D model of the results. 3D model is not available in the EMR/PACS. Normal FFR range is >0.80.  1. Left Main: 0.95  2. LAD: Proximal 0.92, Mid 0.85, Distal 0.75, distal tip 0.67  3. LCX: Proximal 0.95, Mid 0.95, Distal 0.91  4. RCA: Proximal 0.95, Mid 0.95, Distal RCA was not analyzed.  IMPRESSION: FFRct reports a flow limiting lesion in the distal LAD. Given this is the distal portion of the vessel, recommend optimization of guideline directed medical therapy to include antianginals prior to cardiac catheterization consideration.  Note: These examples are not recommendations of [employer] and only provided as examples of what other customers are doing.   Electronically Signed By: Adam Ripple DO On: 03/26/2020 18:02   CT CORONARY MORPH W/CTA COR W/SCORE 03/23/2020  Addendum 03/23/2020  6:53 PM ADDENDUM REPORT: 03/23/2020 18:50  CLINICAL DATA:  This is a 67 year old male with chest pain.  EXAM: Cardiac/Coronary  CT  TECHNIQUE: The patient was scanned on a [employer].  FINDINGS: A 120 kV prospective scan was triggered in the descending  thoracic aorta at 111 HU's. Axial non-contrast 3 mm slices were carried out through the heart. The data set was analyzed on a dedicated work station and scored using the Agatson method. Gantry rotation speed was 250 msecs and collimation was .6 mm. No beta blockade and 0.8 mg of sl NTG was given. The 3D data set was reconstructed in 5% intervals of the 67-82 % of the R-R cycle. Diastolic phases were analyzed on a dedicated work station using MPR, MIP and VRT modes. The patient received 80 cc of contrast.  Aorta: Normal size.  No calcifications.  No dissection.  Aortic Valve:  Trileaflet.  No calcifications.  Coronary Arteries:  Normal coronary origin.  Right dominance.  RCA is a large dominant artery that gives rise to PDA and PLVB. There is no plaque in the proximal RCA. Motion artifact in the mid and distal RCA with no clear visibility.  Left main is a large artery that gives rise to LAD and LCX arteries.  LAD is a large vessel. There is a moderate (50-69%) calcified plaque in the proximal LAD. The mid and distal LAD with no plaque.  LCX is a non-dominant artery that gives rise to one large OM1 branch. There is moderate (50-69%) calcified plaque in the proximal LCX. The mid and distal LCX with no plaques.  Other findings:  Normal pulmonary vein drainage into the left  atrium.  Normal left atrial appendage without a thrombus.  Normal size of the pulmonary artery.  IMPRESSION: 1. Coronary calcium score of 149. This was 37 percentile for age and sex matched control.  2. Normal coronary origin with right dominance.  3. Moderate Coronary artery disease. CADRADS 3. This study will be sent for FFRct.  Adam Ripple, DO   Electronically Signed By: Adam Ripple DO On: 03/23/2020 18:50  Narrative EXAM: OVER-READ INTERPRETATION  CT CHEST  The following report is an over-read performed by radiologist Dr. Trudie Reed of [facility] on 03/23/2020.  This over-read does not include interpretation of cardiac or coronary anatomy or pathology. The coronary calcium score/coronary CTA interpretation by the cardiologist is attached.  COMPARISON:  None.  FINDINGS: Atherosclerotic calcifications in the thoracic aorta. Within the visualized portions of the thorax there are no suspicious appearing pulmonary nodules or masses, there is no acute consolidative airspace disease, no pleural effusions, no pneumothorax and no lymphadenopathy. Visualized portions of the upper abdomen are unremarkable. There are no aggressive appearing lytic or blastic lesions noted in the visualized portions of the skeleton.  IMPRESSION: 1.  Aortic Atherosclerosis (ICD10-I70.0).  Electronically Signed: By: Trudie Reed M.D. On: 03/23/2020 16:13          Risk Assessment/Calculations:             Physical Exam:   VS:  BP 132/74   Pulse (!) 58   Ht 6\' 1"  (1.854 m)   Wt 235 lb 9.6 oz (106.9 kg)   SpO2 97%   BMI 31.08 kg/m    Wt Readings from Last 3 Encounters:  12/30/22 235 lb 9.6 oz (106.9 kg)  05/01/22 240 lb (108.9 kg)  01/22/22 249 lb 6.4 oz (113.1 kg)    GEN: Well nourished, well developed in no acute distress NECK: No JVD; No carotid bruits CARDIAC: RRR, no murmurs, rubs, gallops RESPIRATORY:  Clear to auscultation without rales, wheezing or rhonchi  ABDOMEN: Soft, non-tender, non-distended EXTREMITIES:  No edema; No deformity   ASSESSMENT AND PLAN: .   CAD-mild, nonobstructive CAD per left heart cath in 2022. Stable with no anginal symptoms. No indication for ischemic evaluation.  Continue aspirin 81 mg daily, continue metoprolol 25 mg daily, continue nitroglycerin as needed, continue Crestor 10 mg daily. Heart healthy diet and regular cardiovascular exercise encouraged.     Hypertension-blood pressure is controlled today at 132/74, his blood pressure cuff was validated with our office reading, may be reading slightly higher than ours but  overall consistent.  Continue metoprolol 25 mg daily, continue spironolactone 25 mg daily, continue Micardis 80 mg daily.  I think his recent elevation in blood pressure is correlating with his increased dose of testosterone.  Will have him keep a blood pressure log for 2 weeks at home.  If his blood pressure is not optimally controlled, would not increase spironolactone as he has a history of gynecomastia but we could switch him to eplerenone.  Will check BMET today to assess kidney function and potassium.  Dyslipidemia-monitored formally by his PCP, continue Crestor 10 mg daily.       Dispo: Blood pressure log x 2 weeks, labs per above.  Follow-up with Dr. Tomie [name] in 6 months.  Signed, Flossie Dibble, NP

## 2022-12-30 ENCOUNTER — Encounter: Payer: Self-pay | Admitting: Cardiology

## 2022-12-30 ENCOUNTER — Ambulatory Visit: Payer: PPO | Attending: Cardiology | Admitting: Cardiology

## 2022-12-30 VITALS — BP 132/74 | HR 58 | Ht 73.0 in | Wt 235.6 lb

## 2022-12-30 DIAGNOSIS — I10 Essential (primary) hypertension: Secondary | ICD-10-CM

## 2022-12-30 DIAGNOSIS — I2510 Atherosclerotic heart disease of native coronary artery without angina pectoris: Secondary | ICD-10-CM | POA: Diagnosis not present

## 2022-12-30 DIAGNOSIS — E785 Hyperlipidemia, unspecified: Secondary | ICD-10-CM | POA: Diagnosis not present

## 2022-12-30 NOTE — Patient Instructions (Addendum)
Complete a 2 week blood pressure log and return to the office when complete.   Medication Instructions:  Your physician recommends that you continue on your current medications as directed. Please refer to the Current Medication list given to you today.  *If you need a refill on your cardiac medications before your next appointment, please call your pharmacy*   Lab Work:  Return in two weeks for a complete metabolic panel and a complete blood count.  If you have labs (blood work) drawn today and your tests are completely normal, you will receive your results only by: MyChart Message (if you have MyChart) OR A paper copy in the mail If you have any lab test that is abnormal or we need to change your treatment, we will call you to review the results.   Testing/Procedures: None Ordered   Follow-Up: At [facility], you and your health needs are our priority.  As part of our continuing mission to provide you with exceptional heart care, we have created designated Provider Care Teams.  These Care Teams include your primary Cardiologist (physician) and Advanced Practice Providers (APPs -  Physician Assistants and Nurse Practitioners) who all work together to provide you with the care you need, when you need it.  We recommend signing up for the patient portal called "MyChart".  Sign up information is provided on this After Visit Summary.  MyChart is used to connect with patients for Virtual Visits (Telemedicine).  Patients are able to view lab/test results, encounter notes, upcoming appointments, etc.  Non-urgent messages can be sent to your provider as well.   To learn more about what you can do with MyChart, go to [email or web address].    Your next appointment:   6 months with Dr. Tomie China

## 2023-01-09 DIAGNOSIS — E349 Endocrine disorder, unspecified: Secondary | ICD-10-CM | POA: Diagnosis not present

## 2023-01-15 DIAGNOSIS — I2510 Atherosclerotic heart disease of native coronary artery without angina pectoris: Secondary | ICD-10-CM | POA: Diagnosis not present

## 2023-01-15 DIAGNOSIS — I10 Essential (primary) hypertension: Secondary | ICD-10-CM | POA: Diagnosis not present

## 2023-01-16 ENCOUNTER — Telehealth: Payer: Self-pay | Admitting: Cardiology

## 2023-01-16 DIAGNOSIS — H43812 Vitreous degeneration, left eye: Secondary | ICD-10-CM | POA: Diagnosis not present

## 2023-01-16 LAB — CBC
Hematocrit: 48.2 % (ref 37.5–51.0)
Hemoglobin: 16.1 g/dL (ref 13.0–17.7)
MCH: 32.3 pg (ref 26.6–33.0)
MCHC: 33.4 g/dL (ref 31.5–35.7)
MCV: 97 fL (ref 79–97)
Platelets: 144 x10E3/uL — ABNORMAL LOW (ref 150–450)
RBC: 4.99 x10E6/uL (ref 4.14–5.80)
RDW: 12.3 % (ref 11.6–15.4)
WBC: 4.8 x10E3/uL (ref 3.4–10.8)

## 2023-01-16 LAB — COMPREHENSIVE METABOLIC PANEL WITH GFR
ALT: 19 IU/L (ref 0–44)
AST: 30 IU/L (ref 0–40)
Albumin: 4.5 g/dL (ref 3.9–4.9)
Alkaline Phosphatase: 69 IU/L (ref 44–121)
BUN/Creatinine Ratio: 14 (ref 10–24)
BUN: 15 mg/dL (ref 8–27)
Bilirubin Total: 0.8 mg/dL (ref 0.0–1.2)
CO2: 23 mmol/L (ref 20–29)
Calcium: 8.8 mg/dL (ref 8.6–10.2)
Chloride: 103 mmol/L (ref 96–106)
Creatinine, Ser: 1.07 mg/dL (ref 0.76–1.27)
Globulin, Total: 2.2 g/dL (ref 1.5–4.5)
Glucose: 138 mg/dL — ABNORMAL HIGH (ref 70–99)
Potassium: 4 mmol/L (ref 3.5–5.2)
Sodium: 141 mmol/L (ref 134–144)
Total Protein: 6.7 g/dL (ref 6.0–8.5)
eGFR: 77 mL/min/1.73

## 2023-01-16 MED ORDER — CHLORTHALIDONE 25 MG PO TABS: 25.0000 mg | ORAL_TABLET | Freq: Every day | ORAL | 3 refills | Status: DC

## 2023-01-16 NOTE — Addendum Note (Signed)
 Addended by: CARLIN DELON BROCKS on: 01/16/2023 11:47 AM   Modules accepted: Orders

## 2023-01-16 NOTE — Addendum Note (Signed)
 Addended by: CARLIN DELON BROCKS on: 01/16/2023 11:49 AM   Modules accepted: Orders

## 2023-01-19 ENCOUNTER — Telehealth: Payer: Self-pay | Admitting: Emergency Medicine

## 2023-01-19 DIAGNOSIS — I10 Essential (primary) hypertension: Secondary | ICD-10-CM

## 2023-01-19 NOTE — Telephone Encounter (Signed)
-----   Message from Delon JAYSON Hoover sent at 01/16/2023 10:49 AM EST ----- Labs look good. I resulted his BP log and advised to stop spiro and start Chlorthalidone  and then repeat BMET in 2 weeks -- so just make sure after he starts new med we recheck BMET in 2 weeks.

## 2023-01-19 NOTE — Addendum Note (Signed)
 Addended by: Lonia Farber on: 01/19/2023 09:05 AM   Modules accepted: Orders

## 2023-01-19 NOTE — Telephone Encounter (Signed)
 Results reviewed with pt as per Wallis Bamberg NP's note.  Pt verbalized understanding and had no additional questions. Routed to PCP.

## 2023-01-19 NOTE — Telephone Encounter (Addendum)
 Error

## 2023-01-19 NOTE — Telephone Encounter (Signed)
 Called patient and left a voicemail

## 2023-01-21 DIAGNOSIS — E349 Endocrine disorder, unspecified: Secondary | ICD-10-CM | POA: Diagnosis not present

## 2023-01-23 DIAGNOSIS — L814 Other melanin hyperpigmentation: Secondary | ICD-10-CM | POA: Diagnosis not present

## 2023-01-23 DIAGNOSIS — C44319 Basal cell carcinoma of skin of other parts of face: Secondary | ICD-10-CM | POA: Diagnosis not present

## 2023-01-23 DIAGNOSIS — L821 Other seborrheic keratosis: Secondary | ICD-10-CM | POA: Diagnosis not present

## 2023-01-23 DIAGNOSIS — L570 Actinic keratosis: Secondary | ICD-10-CM | POA: Diagnosis not present

## 2023-01-23 DIAGNOSIS — L578 Other skin changes due to chronic exposure to nonionizing radiation: Secondary | ICD-10-CM | POA: Diagnosis not present

## 2023-02-04 DIAGNOSIS — R0981 Nasal congestion: Secondary | ICD-10-CM | POA: Diagnosis not present

## 2023-02-04 DIAGNOSIS — E349 Endocrine disorder, unspecified: Secondary | ICD-10-CM | POA: Diagnosis not present

## 2023-02-04 DIAGNOSIS — R040 Epistaxis: Secondary | ICD-10-CM | POA: Diagnosis not present

## 2023-02-04 DIAGNOSIS — I10 Essential (primary) hypertension: Secondary | ICD-10-CM | POA: Diagnosis not present

## 2023-02-05 ENCOUNTER — Telehealth: Payer: Self-pay | Admitting: Cardiology

## 2023-02-05 LAB — BASIC METABOLIC PANEL WITH GFR
BUN/Creatinine Ratio: 15 (ref 10–24)
BUN: 19 mg/dL (ref 8–27)
CO2: 22 mmol/L (ref 20–29)
Calcium: 8.7 mg/dL (ref 8.6–10.2)
Chloride: 97 mmol/L (ref 96–106)
Creatinine, Ser: 1.28 mg/dL — ABNORMAL HIGH (ref 0.76–1.27)
Glucose: 162 mg/dL — ABNORMAL HIGH (ref 70–99)
Potassium: 4.1 mmol/L (ref 3.5–5.2)
Sodium: 137 mmol/L (ref 134–144)
eGFR: 62 mL/min/1.73

## 2023-02-05 NOTE — Telephone Encounter (Signed)
Lab results routed to PCP

## 2023-02-05 NOTE — Telephone Encounter (Signed)
Patient is requesting his results be sent to his PCP, Dr. Leonor Liv.

## 2023-02-12 DIAGNOSIS — J189 Pneumonia, unspecified organism: Secondary | ICD-10-CM | POA: Diagnosis not present

## 2023-02-12 DIAGNOSIS — I10 Essential (primary) hypertension: Secondary | ICD-10-CM | POA: Diagnosis not present

## 2023-02-17 DIAGNOSIS — E349 Endocrine disorder, unspecified: Secondary | ICD-10-CM | POA: Diagnosis not present

## 2023-02-23 DIAGNOSIS — R053 Chronic cough: Secondary | ICD-10-CM | POA: Diagnosis not present

## 2023-02-23 DIAGNOSIS — Z6832 Body mass index (BMI) 32.0-32.9, adult: Secondary | ICD-10-CM | POA: Diagnosis not present

## 2023-02-23 DIAGNOSIS — M62838 Other muscle spasm: Secondary | ICD-10-CM | POA: Diagnosis not present

## 2023-03-02 DIAGNOSIS — E291 Testicular hypofunction: Secondary | ICD-10-CM | POA: Diagnosis not present

## 2023-03-13 DIAGNOSIS — E349 Endocrine disorder, unspecified: Secondary | ICD-10-CM | POA: Diagnosis not present

## 2023-03-25 DIAGNOSIS — E291 Testicular hypofunction: Secondary | ICD-10-CM | POA: Diagnosis not present

## 2023-03-30 DIAGNOSIS — J329 Chronic sinusitis, unspecified: Secondary | ICD-10-CM | POA: Diagnosis not present

## 2023-04-06 DIAGNOSIS — E349 Endocrine disorder, unspecified: Secondary | ICD-10-CM | POA: Diagnosis not present

## 2023-04-16 DIAGNOSIS — J019 Acute sinusitis, unspecified: Secondary | ICD-10-CM | POA: Diagnosis not present

## 2023-04-16 DIAGNOSIS — B9689 Other specified bacterial agents as the cause of diseases classified elsewhere: Secondary | ICD-10-CM | POA: Diagnosis not present

## 2023-04-17 DIAGNOSIS — E349 Endocrine disorder, unspecified: Secondary | ICD-10-CM | POA: Diagnosis not present

## 2023-04-20 ENCOUNTER — Telehealth: Payer: Self-pay | Admitting: Cardiology

## 2023-04-20 ENCOUNTER — Ambulatory Visit: Attending: Cardiology

## 2023-04-20 VITALS — BP 120/80 | Resp 18 | Ht 73.0 in | Wt 243.2 lb

## 2023-04-20 DIAGNOSIS — R002 Palpitations: Secondary | ICD-10-CM

## 2023-04-20 NOTE — Telephone Encounter (Signed)
 Recommendations reviewed with pt as per Dr. Kem Parkinson note.  Pt verbalized understanding and had no additional questions.

## 2023-04-20 NOTE — Telephone Encounter (Signed)
 Spoke with patient who states that he has been feeling lightheaded at times. Pt also states that he has felt fluttering. Bp reading today was prior to medication. Pt does not have BP readings at times that he feels lightheaded. Denies chest pain.

## 2023-04-20 NOTE — Progress Notes (Signed)
   Nurse Visit   Date of Encounter: 04/20/2023 ID: Adam [name], DOB Jun 06, 1955, MRN [id number]  PCP:  Gaither Juba, MD   [facility] Providers Cardiologist:  Nelia Balzarine, MD      Visit Details   VS:  BP 120/80 (BP Location: Right Arm, Patient Position: Sitting, Cuff Size: Normal)   Resp 18   Ht 6\' 1"  (1.854 m)   Wt 243 lb 3.2 oz (110.3 kg)   SpO2 97%   BMI 32.09 kg/m  , BMI Body mass index is 32.09 kg/m.  Wt Readings from Last 3 Encounters:  04/20/23 243 lb 3.2 oz (110.3 kg)  12/30/22 235 lb 9.6 oz (106.9 kg)  05/01/22 240 lb (108.9 kg)     Reason for visit: EKG Performed today: Vitals, EKG, Provider consulted:Revankar, and Education Changes (medications, testing, etc.) : none Length of Visit: 15 minutes  Advised to get kardia mobile.    Medications Adjustments/Labs and Tests Ordered: Orders Placed This Encounter  Procedures   EKG 12-Lead   No orders of the defined types were placed in this encounter.    Signed, Einar Grave, RN  04/20/2023 11:56 AM

## 2023-04-20 NOTE — Telephone Encounter (Signed)
 STAT if patient feels like he/she is going to faint   1. Are you feeling dizzy, lightheaded, or faint right now? lightheaded    2. Have you passed out?  No  (If yes move to .[facility]NC[facility])   3. Do you have any other symptoms? BP Cuff shows irregular heat beat   4. Have you checked your HR and BP (record if available)?  4/11 am 126/74 65, 4/11 pm 138/69 66, 4/13 am  141/74 65

## 2023-04-22 NOTE — Progress Notes (Deleted)
 NEUROLOGY FOLLOW UP OFFICE NOTE  Adam [name] [id number]  Subjective:  Adam [name] is a 68 y.o. year old male with a history of HTN, HLD, DM, pituitary microadenoma c/b hypothyroidism, degenerative disc disease who we last saw on 05/01/22 for neuropathy.  To briefly review: 01/22/22: Patient was walking on an outside track which he does multiple times per week. He started feeling like his feet were going to "fly out from under" him. This started around the summer of 2023. He stopped walking in 10/2021.    On a shining floor, patient felt like he was going to fall. He felt like he had to shuffle his feet in this situation. He does not have this problem when walking on grass. He has not had any recent falls.   He had back injury around 08/2005. He was weed eating a bank. He had pain in his leg radiating from his back. He was given a muscle relaxer.  His pain went away but he was left with numbness in left leg below the calf. He was told a nerve was dead. He has tingling in the left foot and more recently now in the right leg. He has occasionally cramping in the left calf, but this has improved compared to prior. He currently has no significant back pain. He has never an EMG.    Patient takes gabapentin 300 mg BID for "cough neuropathy". This does not help the symptoms in his feet. Patient is on B12 and vit D supplementation. He did this on his own, not because he was ever deficient.   The patient does not report symptoms referable to autonomic dysfunction including impaired sweating, heat or cold intolerance, excessive mucosal dryness, gastroparetic early satiety, postprandial abdominal bloating, constipation, bowel or bladder dyscontrol, or syncope/presyncope/orthostatic intolerance (only rarely).   He does not report any constitutional symptoms like fever, night sweats, anorexia or unintentional weight loss.   EtOH use: none  Restrictive diet? no Family history of neuropathy/myopathy/NM  disease? No   He has not had PT recently.  05/01/22: Labs were significant for HbA1c of 6.5. EMG on 03/10/22 showed evidence of an axonal neuropathy, moderate in degree electrically and the residuals of an old left L5 radiculopathy, mild to moderate in degree electrically.   Patient thinks his symptoms may have improved compared to last visit. He has not had any falls. Patient continues to take gabapentin 300 mg BID. He forgot about my recommendation to take alpha lipoic acid and has not looked into this.   Overall, patient feels well with no new complaints today.  Most recent Assessment and Plan (05/01/22): This is Adam [name], a 68 y.o. male with:   Distal symmetric large fiber polyneuropathy - Moderate electrically on EMG from 03/10/22. His known risk factor is diabetes (most recent A1c was 6.5) Lumbar radiculopathy - residuals of an left L5 radiculopathy on EMG from 03/10/22   Plan: -Continue gabapentin 300 mg BID -Alpha lipoic acid 600 mg once or twice daily -Discussed DM and importance of controlling blood sugars to prevent neuropathy from worsening  Since their last visit: ***  MEDICATIONS:  Outpatient Encounter Medications as of 05/01/2023  Medication Sig   aspirin EC 81 MG tablet Take 81 mg by mouth daily.   cabergoline (DOSTINEX) 0.5 MG tablet Take 0.5 tablets by mouth 2 (two) times a week. 1/2 pill one time a week   chlorthalidone (HYGROTON) 25 MG tablet Take 1 tablet (25 mg total) by mouth daily.  gabapentin (NEURONTIN) 300 MG capsule Take 300 mg by mouth 2 (two) times daily.   levothyroxine (SYNTHROID) 88 MCG tablet Take 88 mcg by mouth daily.   meloxicam (MOBIC) 15 MG tablet Take 15 mg by mouth daily.   metoprolol succinate (TOPROL-XL) 25 MG 24 hr tablet Take 25 mg by mouth daily.   nitroGLYCERIN (NITROSTAT) 0.4 MG SL tablet Place 0.4 mg under the tongue every 5 (five) minutes as needed for chest pain.   NON FORMULARY Take 320 mg by mouth daily. Saw palmetto    pantoprazole (PROTONIX) 40 MG tablet Take 40 mg by mouth daily.   QUERCETIN PO Take 500 mg by mouth daily.   rosuvastatin (CRESTOR) 10 MG tablet Take 10 mg by mouth at bedtime.   SYMBICORT 160-4.5 MCG/ACT inhaler Inhale 1-2 puffs into the lungs as needed for shortness of breath or wheezing.   telmisartan (MICARDIS) 80 MG tablet Take 80 mg by mouth daily.   testosterone cypionate (DEPOTESTOSTERONE CYPIONATE) 200 MG/ML injection Inject 0.8 mLs into the muscle as directed. 0.67ml every 12 days   zolpidem (AMBIEN) 10 MG tablet Take 10 mg by mouth at bedtime as needed for sleep.   No facility-administered encounter medications on file as of 05/01/2023.    PAST MEDICAL HISTORY: Past Medical History:  Diagnosis Date   Abnormal cardiac CT angiography    Abnormal glucose 07/20/2015   Acid reflux    Acquired hypothyroidism 07/20/2015   Angina pectoris (HCC) 03/08/2020   CAD (coronary artery disease) 04/12/2020   Cervical radiculopathy    Chest pain of uncertain etiology 05/15/2020   DDD (degenerative disc disease), cervical    Erectile dysfunction 06/25/2018   Gynecomastia, male 11/23/2017   Hemoptysis    Hyperprolactinemia (HCC) 07/20/2015   Hypertension    Hypogonadism in male 07/20/2015   Mixed dyslipidemia 03/08/2020   Obesity (BMI 30.0-34.9) 03/08/2020   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HCC)    Progressive angina (HCC) 05/15/2020   S/P cardiac cath with coronary bridging but very minimal CAD -not cause of chest pain 05/15/2020    PAST SURGICAL HISTORY: Past Surgical History:  Procedure Laterality Date   CATARACT EXTRACTION     LEFT HEART CATH AND CORONARY ANGIOGRAPHY N/A 05/15/2020   Procedure: LEFT HEART CATH AND CORONARY ANGIOGRAPHY;  Surgeon: Marykay Lex, MD;  Location: [facility] INVASIVE CV LAB;  Service: Cardiovascular;  Laterality: N/A;   RIGHT HEART CATH  2006   ROTATOR CUFF REPAIR Right 04/2016    ALLERGIES: No Known Allergies  FAMILY HISTORY: Family History  Problem  Relation Age of Onset   Hypertension Mother    Ovarian cancer Mother    Hypertension Father    Pancreatic cancer Father    Ovarian cancer Maternal Grandmother    Heart disease Maternal Grandfather    Hypertension Sister    Hypertension Brother     SOCIAL HISTORY: Social History   Tobacco Use   Smoking status: Never   Smokeless tobacco: Never  Vaping Use   Vaping status: Never Used  Substance Use Topics   Alcohol use: Never   Drug use: Never   Social History   Social History Narrative   Are you right handed or left handed? Right    Are you currently employed ? no   What is your current occupation? retired   Do you live at home alone?no   Who lives with you? Wife and dog   What type of home do you live in: 1 story or 2 story? One  Caffeine 2 soda a day      Objective:  Vital Signs:  There were no vitals taken for this visit.  ***  Labs and Imaging review: New results: 02/04/23: BMP significant for glucose 162, Cr 1.28  01/15/23: CBC unremarkable CMP significant for glucose 138  External labs (12/29/22): TSH wnl HbA1c: 5.7  Previously reviewed results: 01/22/22: B12: 1232 HbA1c: 6.5 IFE: no M protein   01/22/21: BMP and LFTs unremarkable   External labs: Prolactin (12/27/21): 0.9 (low) TSH (12/27/21): 0.72 A1c (12/27/20): 5.9 Vit D (06/27/20): 51   Imaging: MRI lumbar spine wo contrast (09/30/12 - outside imaging):      EMG (03/10/22): NCV & EMG Findings: Extensive electrodiagnostic evaluation of the left lower limb with additional nerve conduction studies in the left upper limb and right lower limb shows: Bilateral sural and superficial peroneal/fibular sensory responses are absent. Right median, ulnar, and radial sensory responses are within normal limits. Bilateral tibial (AH) motor response shows reduced amplitude (L 1.77, R 2.4 mV) and the left tibial (AH) shows a prolonged distal onset latency (6.9 ms). Left peroneal/fibular (EDB) motor  response shows prolonged distal onset latency (8.4 ms) and reduced amplitude (0.66 mV). Right peroneal/fibular (EDB), left peroneal/fibular (TA), left median (APB), and left ulnar (ADM) motor responses are within normal limits. Left H reflex is absent. Chronic motor axon loss changes without accompanying active denervation changes are seen in the left tibialis anterior, medial head of gastrocnemius, flexor digitorum longus, gluteus medius, and L5 paraspinal muscles.   Impression: This is an abnormal study. 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 The residuals of an old intraspinal canal lesion (ie: motor radiculopathy) at the left L5 root, mild to moderate in degree electrically.  Assessment/Plan:  This is Adam [name], a 68 y.o. male with: ***   Plan: ***  Return to clinic in ***  Total time spent reviewing records, interview, history/exam, documentation, and coordination of care on day of encounter:  *** min  Rommie Coats, MD

## 2023-04-24 NOTE — Telephone Encounter (Signed)
 Addressed via subsequent phone calls. Closing duplicate encounter.

## 2023-04-29 DIAGNOSIS — E349 Endocrine disorder, unspecified: Secondary | ICD-10-CM | POA: Diagnosis not present

## 2023-05-01 ENCOUNTER — Ambulatory Visit: Payer: PPO | Admitting: Neurology

## 2023-05-07 DIAGNOSIS — I10 Essential (primary) hypertension: Secondary | ICD-10-CM | POA: Diagnosis not present

## 2023-05-07 DIAGNOSIS — Z79899 Other long term (current) drug therapy: Secondary | ICD-10-CM | POA: Diagnosis not present

## 2023-05-07 DIAGNOSIS — R7309 Other abnormal glucose: Secondary | ICD-10-CM | POA: Diagnosis not present

## 2023-05-07 DIAGNOSIS — K219 Gastro-esophageal reflux disease without esophagitis: Secondary | ICD-10-CM | POA: Diagnosis not present

## 2023-05-07 DIAGNOSIS — Z1331 Encounter for screening for depression: Secondary | ICD-10-CM | POA: Diagnosis not present

## 2023-05-07 DIAGNOSIS — E782 Mixed hyperlipidemia: Secondary | ICD-10-CM | POA: Diagnosis not present

## 2023-05-07 DIAGNOSIS — Z6832 Body mass index (BMI) 32.0-32.9, adult: Secondary | ICD-10-CM | POA: Diagnosis not present

## 2023-05-07 DIAGNOSIS — Z0000 Encounter for general adult medical examination without abnormal findings: Secondary | ICD-10-CM | POA: Diagnosis not present

## 2023-05-07 DIAGNOSIS — R7303 Prediabetes: Secondary | ICD-10-CM | POA: Diagnosis not present

## 2023-05-07 DIAGNOSIS — Z1339 Encounter for screening examination for other mental health and behavioral disorders: Secondary | ICD-10-CM | POA: Diagnosis not present

## 2023-05-07 DIAGNOSIS — Z125 Encounter for screening for malignant neoplasm of prostate: Secondary | ICD-10-CM | POA: Diagnosis not present

## 2023-05-07 DIAGNOSIS — M1990 Unspecified osteoarthritis, unspecified site: Secondary | ICD-10-CM | POA: Diagnosis not present

## 2023-05-07 LAB — LAB REPORT - SCANNED
A1c: 6
EGFR: 59

## 2023-05-11 DIAGNOSIS — E291 Testicular hypofunction: Secondary | ICD-10-CM | POA: Diagnosis not present

## 2023-05-14 DIAGNOSIS — Z961 Presence of intraocular lens: Secondary | ICD-10-CM | POA: Diagnosis not present

## 2023-05-15 DIAGNOSIS — J329 Chronic sinusitis, unspecified: Secondary | ICD-10-CM | POA: Diagnosis not present

## 2023-05-19 DIAGNOSIS — J329 Chronic sinusitis, unspecified: Secondary | ICD-10-CM | POA: Diagnosis not present

## 2023-05-19 DIAGNOSIS — J3489 Other specified disorders of nose and nasal sinuses: Secondary | ICD-10-CM | POA: Diagnosis not present

## 2023-05-19 DIAGNOSIS — J321 Chronic frontal sinusitis: Secondary | ICD-10-CM | POA: Diagnosis not present

## 2023-05-22 DIAGNOSIS — E291 Testicular hypofunction: Secondary | ICD-10-CM | POA: Diagnosis not present

## 2023-06-03 DIAGNOSIS — E349 Endocrine disorder, unspecified: Secondary | ICD-10-CM | POA: Diagnosis not present

## 2023-06-15 DIAGNOSIS — E349 Endocrine disorder, unspecified: Secondary | ICD-10-CM | POA: Diagnosis not present

## 2023-06-26 DIAGNOSIS — E349 Endocrine disorder, unspecified: Secondary | ICD-10-CM | POA: Diagnosis not present

## 2023-06-30 DIAGNOSIS — R7303 Prediabetes: Secondary | ICD-10-CM | POA: Diagnosis not present

## 2023-06-30 DIAGNOSIS — E039 Hypothyroidism, unspecified: Secondary | ICD-10-CM | POA: Diagnosis not present

## 2023-06-30 DIAGNOSIS — E221 Hyperprolactinemia: Secondary | ICD-10-CM | POA: Diagnosis not present

## 2023-06-30 DIAGNOSIS — E291 Testicular hypofunction: Secondary | ICD-10-CM | POA: Diagnosis not present

## 2023-06-30 NOTE — Progress Notes (Signed)
 Cardiology Office Note:  .   Date:  07/01/2023  ID:  Adam [name], DOB 08/04/1955, MRN [id number] PCP: Adam Marcellus RAMAN, MD  [facility] Providers Cardiologist:  Adam Hamidi JONELLE Crape, MD    History of Present Illness: Adam   [name] is a 68 y.o. male with a past medical history of hypertension, CAD nonobstructive per left heart cath, acid reflux, hypothyroidism, pituitary microadenoma with hyperprolactinemia, dyslipidemia, ED.  05/15/2020 left heart cath angiographically minimal CAD with 2 focal areas of subendocardial bridging in the LAD but no stenosis 06/07/2020 coronary CTA calcium  score 149, 64th percentile >> FFR reported flow-limiting lesion in the distal LAD 02/15/2020 MPI No reversible ischemia, small stable fixed perfusion defect, EF 57%  [name] [facility] with [facility] in 2022 for the management of his hypertension.  [name] underwent a left heart cath in 2022 revealing minimal CAD.  Evaluated by Dr. Crape on 01/21/2021, [name] was walking daily approximately 3 miles, [name] was doing well from a cardiac perspective, advised to follow-up in 6 months.  Most recently was evaluated by myself on 12/30/2022 with concerns of elevated blood pressure readings, [name] had also recently had his testosterone  dose increased. Adam [name] presents today accompanied by his wife for follow-up of his hypertension.  [name] had been evaluated by the PCP since [name] was last in our office, his chlorthalidone  was stopped and [name] was started on carvedilol.  Since this time, [name] has noticed that his blood pressure has been uptrending.  Today is 140/70.  Reviewed his blood pressure log at home, most of the time his blood pressure appears to be well-controlled. [name] denies chest pain, palpitations, dyspnea, pnd, orthopnea, n, v, dizziness, syncope, edema, weight gain, or early satiety.    ROS: Review of Systems  All other systems reviewed and are negative.   Studies Reviewed: .        Cardiac Studies & Procedures    ______________________________________________________________________________________________ CARDIAC CATHETERIZATION  CARDIAC CATHETERIZATION 05/15/2020  Conclusion  The left ventricular systolic function is normal. The left ventricular ejection fraction is 55-65% by visual estimate.  LV end diastolic pressure is normal.  There is no aortic valve stenosis.  Mostly normal coronaries with a Left Dominant Coronary System  Prox LAD lesion is 5% stenosed. - > This site was considered to be moderate by coronary CTA, but likely because the vessel dives down potentially under the myocardium temporarily.  Mid LAD-1 lesion is 10% stenosed. Mid LAD-2 lesion is 10% stenosed. -2 segments of mild kinking likely the beginning and end of the myocardial segment.  SUMMARY  Angiographically minimal CAD with 2 focal areas of subendocardial bridging in the LAD but no stenoses.  Normal EF with normal EDP.    RECOMMENDATIONS  Consider noncardiac chest pain  For resting chest pain if it is cardiac, could consider vasodilatory agents for possible spasm.    Adam Clay, MD  Findings Coronary Findings Diagnostic  Dominance: Left  Left Main Vessel was injected. Vessel is normal in caliber and large. Vessel is angiographically normal.  Left Anterior Descending Vessel was injected. Vessel is normal in caliber. Vessel is angiographically normal. Prox LAD lesion is 5% stenosed. The lesion is located at the bend. Vessel appears to dive potentially as a short bridging segment at this location -- site called 50-69% on CTA (NO NOTABLE STENOSIS) Mid LAD-1 lesion is 10% stenosed. Beginning of myocardial bridging Mid LAD-2 lesion is 10% stenosed. End of myocardial bridging.  First Diagonal Branch Vessel is moderate in  size.  Lateral First Diagonal Branch Vessel is small in size.  Third Septal Branch Vessel is small in size.  Left Circumflex Vessel was injected. Vessel is normal in caliber  and large. Vessel is angiographically normal.  First Left Posterolateral Branch Vessel is small in size.  Second Left Posterolateral Branch Vessel is small in size.  Right Coronary Artery Vessel was injected. Vessel is normal in caliber and small. Vessel is angiographically normal.  Right Ventricular Branch Vessel is small in size.  Intervention  No interventions have been documented.   STRESS TESTS  MYOCARDIAL PERFUSION IMAGING 02/15/2020        CT SCANS  CT CORONARY FRACTIONAL FLOW RESERVE DATA PREP 03/23/2020  Narrative EXAM: FFRCT ANALYSIS  FINDINGS: FFRct analysis was performed on the original cardiac CT angiogram dataset. Diagrammatic representation of the FFRct analysis is provided in a separate PDF document in PACS. This dictation was created using the PDF document and an interactive 3D model of the results. 3D model is not available in the EMR/PACS. Normal FFR range is >0.80.  1. Left Main: 0.95  2. LAD: Proximal 0.92, Mid 0.85, Distal 0.75, distal tip 0.67  3. LCX: Proximal 0.95, Mid 0.95, Distal 0.91  4. RCA: Proximal 0.95, Mid 0.95, Distal RCA was not analyzed.  IMPRESSION: FFRct reports a flow limiting lesion in the distal LAD. Given this is the distal portion of the vessel, recommend optimization of guideline directed medical therapy to include antianginals prior to cardiac catheterization consideration.  Note: These examples are not recommendations of Heart[employer] and only provided as examples of what other customers are doing.   Electronically Signed By: Adam  Tobb DO On: 03/26/2020 18:02   CT CORONARY MORPH W/CTA COR W/SCORE 03/23/2020  Addendum 03/23/2020  6:53 PM ADDENDUM REPORT: 03/23/2020 18:50  CLINICAL DATA:  This is a 68 year old male with chest pain.  EXAM: Cardiac/Coronary  CT  TECHNIQUE: The patient was scanned on a [employer].  FINDINGS: A 120 kV prospective scan was triggered in the descending  thoracic aorta at 111 HU's. Axial non-contrast 3 mm slices were carried out through the heart. The data set was analyzed on a dedicated work station and scored using the Agatson method. Gantry rotation speed was 250 msecs and collimation was .6 mm. No beta blockade and 0.8 mg of sl NTG was given. The 3D data set was reconstructed in 5% intervals of the 67-82 % of the R-R cycle. Diastolic phases were analyzed on a dedicated work station using MPR, MIP and VRT modes. The patient received 80 cc of contrast.  Aorta: Normal size.  No calcifications.  No dissection.  Aortic Valve:  Trileaflet.  No calcifications.  Coronary Arteries:  Normal coronary origin.  Right dominance.  RCA is a large dominant artery that gives rise to PDA and PLVB. There is no plaque in the proximal RCA. Motion artifact in the mid and distal RCA with no clear visibility.  Left main is a large artery that gives rise to LAD and LCX arteries.  LAD is a large vessel. There is a moderate (50-69%) calcified plaque in the proximal LAD. The mid and distal LAD with no plaque.  LCX is a non-dominant artery that gives rise to one large OM1 branch. There is moderate (50-69%) calcified plaque in the proximal LCX. The mid and distal LCX with no plaques.  Other findings:  Normal pulmonary vein drainage into the left atrium.  Normal left atrial appendage without a thrombus.  Normal size  of the pulmonary artery.  IMPRESSION: 1. Coronary calcium  score of 149. This was 60 percentile for age and sex matched control.  2. Normal coronary origin with right dominance.  3. Moderate Coronary artery disease. CADRADS 3. This study will be sent for FFRct.  Adam Tobb, DO   Electronically Signed By: Adam  Tobb DO On: 03/23/2020 18:50  Narrative EXAM: OVER-READ INTERPRETATION  CT CHEST  The following report is an over-read performed by radiologist Dr. Toribio Aye of [facility] on 03/23/2020.  This over-read does not include interpretation of cardiac or coronary anatomy or pathology. The coronary calcium  score/coronary CTA interpretation by the cardiologist is attached.  COMPARISON:  None.  FINDINGS: Atherosclerotic calcifications in the thoracic aorta. Within the visualized portions of the thorax there are no suspicious appearing pulmonary nodules or masses, there is no acute consolidative airspace disease, no pleural effusions, no pneumothorax and no lymphadenopathy. Visualized portions of the upper abdomen are unremarkable. There are no aggressive appearing lytic or blastic lesions noted in the visualized portions of the skeleton.  IMPRESSION: 1.  Aortic Atherosclerosis (ICD10-I70.0).  Electronically Signed: By: Toribio Aye M.D. On: 03/23/2020 16:13     ______________________________________________________________________________________________      Risk Assessment/Calculations:     HYPERTENSION CONTROL Vitals:   07/01/23 1123 07/01/23 1634  BP: (!) 140/70 (!) 140/70    The patient's blood pressure is elevated above target today.  In order to address the patient's elevated BP: Blood pressure will be monitored at home to determine if medication changes need to be made.          Physical Exam:   VS:  BP (!) 140/70   Pulse 61   Ht 6' 1 (1.854 m)   Wt 235 lb (106.6 kg)   SpO2 95%   BMI 31.00 kg/m    Wt Readings from Last 3 Encounters:  07/01/23 235 lb (106.6 kg)  04/20/23 243 lb 3.2 oz (110.3 kg)  12/30/22 235 lb 9.6 oz (106.9 kg)    GEN: Well nourished, well developed in no acute distress NECK: No JVD; No carotid bruits CARDIAC: RRR, no murmurs, rubs, gallops RESPIRATORY:  Clear to auscultation without rales, wheezing or rhonchi  ABDOMEN: Soft, non-tender, non-distended EXTREMITIES:  No edema; No deformity   ASSESSMENT AND PLAN: .   CAD-mild, nonobstructive CAD per left heart cath in 2022. Stable with no anginal symptoms. No indication  for ischemic evaluation.  Continue aspirin  81 mg daily, continue metoprolol  25 mg daily, continue nitroglycerin  as needed, continue Crestor  10 mg daily. Heart healthy diet and regular cardiovascular exercise encouraged.     Hypertension-blood pressure is elevated today at 140/70, his blood pressure is increased since his chlorthalidone  was stopped and it was replaced with carvedilol.  [name] was previously on metoprolol  but this caused dizziness so I do not think we can increase his carvedilol dose for better blood pressure control.  Per his blood pressure log, it looks like his blood pressure is typically controlled at home although [name] does have some elevated blood pressure readings.  I suggest that [name] decrease his use of Mobic, watch his salt intake, try lose approximately 10 pounds and hopefully we will not have to make any further adjustments to his antihypertensive agents.  Continue Micardis 80 mg daily, carvedilol 3.125 mg twice daily, currently on spironolactone --[name] cannot recall his current dose.  If blood pressure continues to be elevated we could add amlodipine.  Dyslipidemia-monitored formally by his PCP, continue Crestor  10 mg daily.  Palpitations-quiescent, continue carvedilol 3.25 mg twice daily.       Dispo: Suggest [name] decrease his Mobic, can take Tylenol  to help with his arthritic pain, decrease salt intake and work on losing approximately 10 pounds, this will hopefully get his blood pressure where it needs to be.  Follow-up in 3 months.  Signed, Adam JAYSON Hoover, NP

## 2023-07-01 ENCOUNTER — Ambulatory Visit: Attending: Cardiology | Admitting: Cardiology

## 2023-07-01 ENCOUNTER — Encounter: Payer: Self-pay | Admitting: Cardiology

## 2023-07-01 VITALS — BP 140/70 | HR 61 | Ht 73.0 in | Wt 235.0 lb

## 2023-07-01 DIAGNOSIS — I10 Essential (primary) hypertension: Secondary | ICD-10-CM

## 2023-07-01 DIAGNOSIS — I2510 Atherosclerotic heart disease of native coronary artery without angina pectoris: Secondary | ICD-10-CM | POA: Diagnosis not present

## 2023-07-01 DIAGNOSIS — R002 Palpitations: Secondary | ICD-10-CM

## 2023-07-01 DIAGNOSIS — E782 Mixed hyperlipidemia: Secondary | ICD-10-CM

## 2023-07-01 NOTE — Patient Instructions (Signed)
 Medication Instructions:   Suggest you decrease your Mobic to half tab daily.   *If you need a refill on your cardiac medications before your next appointment, please call your pharmacy*  Lab Work:  Requesting labs from PCP.   If you have labs (blood work) drawn today and your tests are completely normal, you will receive your results only by: MyChart Message (if you have MyChart) OR A paper copy in the mail If you have any lab test that is abnormal or we need to change your treatment, we will call you to review the results.  Testing/Procedures:  None today  Follow-Up: At [facility], you and your health needs are our priority.  As part of our continuing mission to provide you with exceptional heart care, our providers are all part of one team.  This team includes your primary Cardiologist (physician) and Advanced Practice Providers or APPs (Physician Assistants and Nurse Practitioners) who all work together to provide you with the care you need, when you need it.  Your next appointment:   3 month(s)  Provider:   Delon Hoover, NP [place])    We recommend signing up for the patient portal called MyChart.  Sign up information is provided on this After Visit Summary.  MyChart is used to connect with patients for Virtual Visits (Telemedicine).  Patients are able to view lab/test results, encounter notes, upcoming appointments, etc.  Non-urgent messages can be sent to your provider as well.   To learn more about what you can do with MyChart, go to [email or web address].   Other Instructions   Decrease Mobic to half tab daily  You may take tylenol  ~ 1,000 mg daily   Decrease sodium intake  Lose 6-10 LBS

## 2023-07-08 DIAGNOSIS — E349 Endocrine disorder, unspecified: Secondary | ICD-10-CM | POA: Diagnosis not present

## 2023-07-14 DIAGNOSIS — T485X5A Adverse effect of other anti-common-cold drugs, initial encounter: Secondary | ICD-10-CM | POA: Diagnosis not present

## 2023-07-14 DIAGNOSIS — J329 Chronic sinusitis, unspecified: Secondary | ICD-10-CM | POA: Diagnosis not present

## 2023-07-14 DIAGNOSIS — J310 Chronic rhinitis: Secondary | ICD-10-CM | POA: Diagnosis not present

## 2023-07-20 DIAGNOSIS — E349 Endocrine disorder, unspecified: Secondary | ICD-10-CM | POA: Diagnosis not present

## 2023-07-31 DIAGNOSIS — E349 Endocrine disorder, unspecified: Secondary | ICD-10-CM | POA: Diagnosis not present

## 2023-08-12 DIAGNOSIS — E349 Endocrine disorder, unspecified: Secondary | ICD-10-CM | POA: Diagnosis not present

## 2023-08-24 DIAGNOSIS — E349 Endocrine disorder, unspecified: Secondary | ICD-10-CM | POA: Diagnosis not present

## 2023-09-04 DIAGNOSIS — E349 Endocrine disorder, unspecified: Secondary | ICD-10-CM | POA: Diagnosis not present

## 2023-09-16 DIAGNOSIS — E349 Endocrine disorder, unspecified: Secondary | ICD-10-CM | POA: Diagnosis not present

## 2023-09-28 DIAGNOSIS — E349 Endocrine disorder, unspecified: Secondary | ICD-10-CM | POA: Diagnosis not present

## 2023-09-29 DIAGNOSIS — R040 Epistaxis: Secondary | ICD-10-CM | POA: Diagnosis not present

## 2023-09-29 DIAGNOSIS — J329 Chronic sinusitis, unspecified: Secondary | ICD-10-CM | POA: Diagnosis not present

## 2023-09-29 HISTORY — DX: Chronic sinusitis, unspecified: J32.9

## 2023-10-01 NOTE — Progress Notes (Signed)
 " Cardiology Office Note:  .   Date:  10/02/2023  ID:  Milderd DELENA [name], DOB 09-Jul-1955, MRN [id number] PCP: Ina Marcellus RAMAN, MD  [facility] Providers Cardiologist:  Janoah Menna JONELLE Crape, MD    History of Present Illness: Adam [name]   ALECXIS [name] is a 68 y.o. male with a past medical history of hypertension, CAD nonobstructive per left heart cath, acid reflux, hypothyroidism, pituitary microadenoma with hyperprolactinemia, dyslipidemia, ED.  05/15/2020 left heart cath angiographically minimal CAD with 2 focal areas of subendocardial bridging in the LAD but no stenosis 06/07/2020 coronary CTA calcium  score 149, 64th percentile >> FFR reported flow-limiting lesion in the distal LAD 02/15/2020 MPI No reversible ischemia, small stable fixed perfusion defect, EF 57%  [facility] with [facility] in 2022 for the management of his hypertension.  He underwent a left heart cath in 2022 revealing minimal CAD.  Evaluated by Dr. Crape on 01/21/2021, he was walking daily approximately 3 miles, he was doing well from a cardiac perspective, advised to follow-up in 6 months.  Evaluated by myself on 12/30/2022 with concerns of elevated blood pressure readings, he had also recently had his testosterone  dose increased.  Most recently evaluated by myself 07/01/2023 for follow-up of his hypertension, blood pressure remained elevated, stressed dietary and lifestyle changes with plans to follow-up in 3 months. Adam [name] He presents today for follow-up of his hypertension.  He brings a blood pressure log, blood pressure readings are somewhat sporadic however he is checking it intermittently throughout the day.  In the office today it is 134/68.  He has been on steroids for some time as well as antibiotics for chronic sinusitis, has plans for sinus surgery in the near future.  He denies chest pain, palpitations, dyspnea, pnd, orthopnea, n, v, dizziness, syncope, edema, weight gain, or early satiety.   ROS: Review of Systems  All other  systems reviewed and are negative.   Studies Reviewed: .        Cardiac Studies & Procedures   ______________________________________________________________________________________________ CARDIAC CATHETERIZATION  CARDIAC CATHETERIZATION 05/15/2020  Conclusion  The left ventricular systolic function is normal. The left ventricular ejection fraction is 55-65% by visual estimate.  LV end diastolic pressure is normal.  There is no aortic valve stenosis.  Mostly normal coronaries with a Left Dominant Coronary System  Prox LAD lesion is 5% stenosed. - > This site was considered to be moderate by coronary CTA, but likely because the vessel dives down potentially under the myocardium temporarily.  Mid LAD-1 lesion is 10% stenosed. Mid LAD-2 lesion is 10% stenosed. -2 segments of mild kinking likely the beginning and end of the myocardial segment.  SUMMARY  Angiographically minimal CAD with 2 focal areas of subendocardial bridging in the LAD but no stenoses.  Normal EF with normal EDP.    RECOMMENDATIONS  Consider noncardiac chest pain  For resting chest pain if it is cardiac, could consider vasodilatory agents for possible spasm.    [name], MD  Findings Coronary Findings Diagnostic  Dominance: Left  Left Main Vessel was injected. Vessel is normal in caliber and large. Vessel is angiographically normal.  Left Anterior Descending Vessel was injected. Vessel is normal in caliber. Vessel is angiographically normal. Prox LAD lesion is 5% stenosed. The lesion is located at the bend. Vessel appears to dive potentially as a short bridging segment at this location -- site called 50-69% on CTA (NO NOTABLE STENOSIS) Mid LAD-1 lesion is 10% stenosed. Beginning of myocardial bridging Mid LAD-2 lesion  is 10% stenosed. End of myocardial bridging.  First Diagonal Branch Vessel is moderate in size.  Lateral First Diagonal Branch Vessel is small in size.  Third Septal  Branch Vessel is small in size.  Left Circumflex Vessel was injected. Vessel is normal in caliber and large. Vessel is angiographically normal.  First Left Posterolateral Branch Vessel is small in size.  Second Left Posterolateral Branch Vessel is small in size.  Right Coronary Artery Vessel was injected. Vessel is normal in caliber and small. Vessel is angiographically normal.  Right Ventricular Branch Vessel is small in size.  Intervention  No interventions have been documented.   STRESS TESTS  MYOCARDIAL PERFUSION IMAGING 02/15/2020        CT SCANS  CT CORONARY FRACTIONAL FLOW RESERVE DATA PREP 03/23/2020  Narrative EXAM: FFRCT ANALYSIS  FINDINGS: FFRct analysis was performed on the original cardiac CT angiogram dataset. Diagrammatic representation of the FFRct analysis is provided in a separate PDF document in PACS. This dictation was created using the PDF document and an interactive 3D model of the results. 3D model is not available in the EMR/PACS. Normal FFR range is >0.80.  1. Left Main: 0.95  2. LAD: Proximal 0.92, Mid 0.85, Distal 0.75, distal tip 0.67  3. LCX: Proximal 0.95, Mid 0.95, Distal 0.91  4. RCA: Proximal 0.95, Mid 0.95, Distal RCA was not analyzed.  IMPRESSION: FFRct reports a flow limiting lesion in the distal LAD. Given this is the distal portion of the vessel, recommend optimization of guideline directed medical therapy to include antianginals prior to cardiac catheterization consideration.  Note: These examples are not recommendations of HeartFlow and only provided as examples of what other customers are doing.   Electronically Signed By: Kardie  Tobb DO On: 03/26/2020 18:02   CT CORONARY MORPH W/CTA COR W/SCORE 03/23/2020  Addendum 03/23/2020  6:53 PM ADDENDUM REPORT: 03/23/2020 18:50  CLINICAL DATA:  This is a 68 year old male with chest pain.  EXAM: Cardiac/Coronary  CT  TECHNIQUE: The patient was scanned on a  [employer].  FINDINGS: A 120 kV prospective scan was triggered in the descending thoracic aorta at 111 HU's. Axial non-contrast 3 mm slices were carried out through the heart. The data set was analyzed on a dedicated work station and scored using the Agatson method. Gantry rotation speed was 250 msecs and collimation was .6 mm. No beta blockade and 0.8 mg of sl NTG was given. The 3D data set was reconstructed in 5% intervals of the 67-82 % of the R-R cycle. Diastolic phases were analyzed on a dedicated work station using MPR, MIP and VRT modes. The patient received 80 cc of contrast.  Aorta: Normal size.  No calcifications.  No dissection.  Aortic Valve:  Trileaflet.  No calcifications.  Coronary Arteries:  Normal coronary origin.  Right dominance.  RCA is a large dominant artery that gives rise to PDA and PLVB. There is no plaque in the proximal RCA. Motion artifact in the mid and distal RCA with no clear visibility.  Left main is a large artery that gives rise to LAD and LCX arteries.  LAD is a large vessel. There is a moderate (50-69%) calcified plaque in the proximal LAD. The mid and distal LAD with no plaque.  LCX is a non-dominant artery that gives rise to one large OM1 branch. There is moderate (50-69%) calcified plaque in the proximal LCX. The mid and distal LCX with no plaques.  Other findings:  Normal pulmonary vein drainage  into the left atrium.  Normal left atrial appendage without a thrombus.  Normal size of the pulmonary artery.  IMPRESSION: 1. Coronary calcium  score of 149. This was 22 percentile for age and sex matched control.  2. Normal coronary origin with right dominance.  3. Moderate Coronary artery disease. CADRADS 3. This study will be sent for FFRct.  Kardie Tobb, DO   Electronically Signed By: Kardie  Tobb DO On: 03/23/2020 18:50  Narrative EXAM: OVER-READ INTERPRETATION  CT CHEST  The following report is an over-read  performed by radiologist Dr. Toribio Aye of [facility] on 03/23/2020. This over-read does not include interpretation of cardiac or coronary anatomy or pathology. The coronary calcium  score/coronary CTA interpretation by the cardiologist is attached.  COMPARISON:  None.  FINDINGS: Atherosclerotic calcifications in the thoracic aorta. Within the visualized portions of the thorax there are no suspicious appearing pulmonary nodules or masses, there is no acute consolidative airspace disease, no pleural effusions, no pneumothorax and no lymphadenopathy. Visualized portions of the upper abdomen are unremarkable. There are no aggressive appearing lytic or blastic lesions noted in the visualized portions of the skeleton.  IMPRESSION: 1.  Aortic Atherosclerosis (ICD10-I70.0).  Electronically Signed: By: Toribio Aye M.D. On: 03/23/2020 16:13     ______________________________________________________________________________________________      Risk Assessment/Calculations:             Physical Exam:   VS:  BP 134/68   Pulse 82   Ht 6' 1 (1.854 m)   Wt 238 lb 3.2 oz (108 kg)   SpO2 96%   BMI 31.43 kg/m    Wt Readings from Last 3 Encounters:  10/02/23 238 lb 3.2 oz (108 kg)  07/01/23 235 lb (106.6 kg)  04/20/23 243 lb 3.2 oz (110.3 kg)    GEN: Well nourished, well developed in no acute distress NECK: No JVD; No carotid bruits CARDIAC: RRR, no murmurs, rubs, gallops RESPIRATORY:  Clear to auscultation without rales, wheezing or rhonchi  ABDOMEN: Soft, non-tender, non-distended EXTREMITIES:  No edema; No deformity   ASSESSMENT AND PLAN: .   CAD-mild, nonobstructive CAD per left heart cath in 2022. Stable with no anginal symptoms. No indication for ischemic evaluation.  Continue aspirin  81 mg daily, continue Coreg 3.125 mg twice daily, continue nitroglycerin  as needed, continue Crestor  10 mg daily. Heart healthy diet and regular cardiovascular exercise  encouraged.     Hypertension-blood pressure is acceptable at 134/68, blood pressure log reveals variations however he is not checking at a consistent time each day.  Will have him check his blood pressure 2 hours after he takes his medications in the morning, send MyChart readings after a week.  Continue Coreg 3.125 mg twice daily, spironolactone  25 mg daily, telmisartan 80 mg daily.  If his blood pressure log reveals that his blood pressure is not well-controlled, plan to increase his Coreg.  Dyslipidemia-monitored formally by his PCP, continue Crestor  10 mg daily.  Palpitations-quiescent, continue carvedilol 3.25 mg twice daily.       Dispo: Blood pressure readings via MyChart in 1 week, follow-up in 6 months.  Signed, Delon JAYSON Hoover, NP  "

## 2023-10-02 ENCOUNTER — Ambulatory Visit: Attending: Cardiology | Admitting: Cardiology

## 2023-10-02 ENCOUNTER — Encounter: Payer: Self-pay | Admitting: Cardiology

## 2023-10-02 VITALS — BP 134/68 | HR 82 | Ht 73.0 in | Wt 238.2 lb

## 2023-10-02 DIAGNOSIS — E785 Hyperlipidemia, unspecified: Secondary | ICD-10-CM

## 2023-10-02 DIAGNOSIS — R002 Palpitations: Secondary | ICD-10-CM | POA: Diagnosis not present

## 2023-10-02 DIAGNOSIS — I2510 Atherosclerotic heart disease of native coronary artery without angina pectoris: Secondary | ICD-10-CM

## 2023-10-02 DIAGNOSIS — I10 Essential (primary) hypertension: Secondary | ICD-10-CM | POA: Diagnosis not present

## 2023-10-02 NOTE — Patient Instructions (Signed)
 Medication Instructions:  Your physician recommends that you continue on your current medications as directed. Please refer to the Current Medication list given to you today.  *If you need a refill on your cardiac medications before your next appointment, please call your pharmacy*  Lab Work: NONE If you have labs (blood work) drawn today and your tests are completely normal, you will receive your results only by: MyChart Message (if you have MyChart) OR A paper copy in the mail If you have any lab test that is abnormal or we need to change your treatment, we will call you to review the results.  Testing/Procedures: NONE  Follow-Up: At [facility], you and your health needs are our priority.  As part of our continuing mission to provide you with exceptional heart care, our providers are all part of one team.  This team includes your primary Cardiologist (physician) and Advanced Practice Providers or APPs (Physician Assistants and Nurse Practitioners) who all work together to provide you with the care you need, when you need it.  Your next appointment:   6 month(s)  Provider:   Jennifer Crape, MD    We recommend signing up for the patient portal called MyChart.  Sign up information is provided on this After Visit Summary.  MyChart is used to connect with patients for Virtual Visits (Telemedicine).  Patients are able to view lab/test results, encounter notes, upcoming appointments, etc.  Non-urgent messages can be sent to your provider as well.   To learn more about what you can do with MyChart, go to [email or web address].   Other Instructions Check and record BP two times daily, once in the morning 1-2 hours after taking medication and once in the evening before dinner. You can drop off log at our office or send it through MyChart.

## 2023-10-15 DIAGNOSIS — E349 Endocrine disorder, unspecified: Secondary | ICD-10-CM | POA: Diagnosis not present

## 2023-10-16 ENCOUNTER — Telehealth: Payer: Self-pay | Admitting: Cardiology

## 2023-10-16 NOTE — Telephone Encounter (Signed)
 Mr. Naeem, Thank you for checking your blood pressure at home, according to the results are provided as it appears that your blood pressure is well-controlled, I do not think we need to make any medication changes at this time.  Just bear in mind that the blood pressure will fluctuate throughout the day and it is best to pick a consistent time to check it as you did with the log he provided us .  No changes, continue current medications, let us  know if you need anything.  Best, Delon

## 2023-10-16 NOTE — Telephone Encounter (Signed)
 Pt returning nurse call

## 2023-10-16 NOTE — Telephone Encounter (Signed)
 Left message for the patient to call back.

## 2023-10-27 DIAGNOSIS — E349 Endocrine disorder, unspecified: Secondary | ICD-10-CM | POA: Diagnosis not present

## 2023-10-28 DIAGNOSIS — L570 Actinic keratosis: Secondary | ICD-10-CM | POA: Diagnosis not present

## 2023-10-28 DIAGNOSIS — C44319 Basal cell carcinoma of skin of other parts of face: Secondary | ICD-10-CM | POA: Diagnosis not present

## 2023-11-01 DIAGNOSIS — R079 Chest pain, unspecified: Secondary | ICD-10-CM | POA: Diagnosis not present

## 2023-11-01 DIAGNOSIS — M1990 Unspecified osteoarthritis, unspecified site: Secondary | ICD-10-CM | POA: Diagnosis not present

## 2023-11-01 DIAGNOSIS — I10 Essential (primary) hypertension: Secondary | ICD-10-CM | POA: Diagnosis not present

## 2023-11-01 DIAGNOSIS — R002 Palpitations: Secondary | ICD-10-CM | POA: Diagnosis not present

## 2023-11-01 DIAGNOSIS — I447 Left bundle-branch block, unspecified: Secondary | ICD-10-CM | POA: Diagnosis not present

## 2023-11-01 DIAGNOSIS — K219 Gastro-esophageal reflux disease without esophagitis: Secondary | ICD-10-CM | POA: Diagnosis not present

## 2023-11-01 DIAGNOSIS — Z79899 Other long term (current) drug therapy: Secondary | ICD-10-CM | POA: Diagnosis not present

## 2023-11-01 DIAGNOSIS — I2510 Atherosclerotic heart disease of native coronary artery without angina pectoris: Secondary | ICD-10-CM | POA: Diagnosis not present

## 2023-11-01 DIAGNOSIS — J45909 Unspecified asthma, uncomplicated: Secondary | ICD-10-CM | POA: Diagnosis not present

## 2023-11-01 DIAGNOSIS — Z7982 Long term (current) use of aspirin: Secondary | ICD-10-CM | POA: Diagnosis not present

## 2023-11-09 DIAGNOSIS — E349 Endocrine disorder, unspecified: Secondary | ICD-10-CM | POA: Diagnosis not present

## 2023-11-16 ENCOUNTER — Other Ambulatory Visit: Payer: Self-pay

## 2023-11-18 ENCOUNTER — Encounter: Payer: Self-pay | Admitting: Cardiology

## 2023-11-18 ENCOUNTER — Ambulatory Visit: Attending: Cardiology | Admitting: Cardiology

## 2023-11-18 VITALS — BP 138/72 | HR 70 | Ht 73.0 in | Wt 245.4 lb

## 2023-11-18 DIAGNOSIS — I10 Essential (primary) hypertension: Secondary | ICD-10-CM | POA: Diagnosis not present

## 2023-11-18 DIAGNOSIS — I2510 Atherosclerotic heart disease of native coronary artery without angina pectoris: Secondary | ICD-10-CM | POA: Diagnosis not present

## 2023-11-18 DIAGNOSIS — E782 Mixed hyperlipidemia: Secondary | ICD-10-CM | POA: Diagnosis not present

## 2023-11-18 DIAGNOSIS — E66811 Obesity, class 1: Secondary | ICD-10-CM | POA: Diagnosis not present

## 2023-11-18 NOTE — Patient Instructions (Signed)
 Medication Instructions:  Your physician recommends that you continue on your current medications as directed. Please refer to the Current Medication list given to you today.  *If you need a refill on your cardiac medications before your next appointment, please call your pharmacy*   Lab Work: None ordered If you have labs (blood work) drawn today and your tests are completely normal, you will receive your results only by: MyChart Message (if you have MyChart) OR A paper copy in the mail If you have any lab test that is abnormal or we need to change your treatment, we will call you to review the results.   Testing/Procedures: None ordered   Follow-Up: At [facility], you and your health needs are our priority.  As part of our continuing mission to provide you with exceptional heart care, we have created designated Provider Care Teams.  These Care Teams include your primary Cardiologist (physician) and Advanced Practice Providers (APPs -  Physician Assistants and Nurse Practitioners) who all work together to provide you with the care you need, when you need it.  We recommend signing up for the patient portal called MyChart.  Sign up information is provided on this After Visit Summary.  MyChart is used to connect with patients for Virtual Visits (Telemedicine).  Patients are able to view lab/test results, encounter notes, upcoming appointments, etc.  Non-urgent messages can be sent to your provider as well.   To learn more about what you can do with MyChart, go to [email or web address].    Your next appointment:   Follow up as needed   The format for your next appointment:   In Person  Provider:   Jennifer Crape, MD    Other Instructions none  Important Information About Sugar

## 2023-11-18 NOTE — Progress Notes (Signed)
 Cardiology Office Note:    Date:  11/18/2023   ID:  Adam [name], DOB 1955/09/06, MRN [id number]  PCP:  Ina Marcellus RAMAN, MD  Cardiologist:  Jennifer JONELLE Crape, MD   Referring MD: Ina Marcellus RAMAN, MD    ASSESSMENT:    1. Mild CAD   2. Primary hypertension   3. Mixed dyslipidemia   4. Obesity (BMI 30.0-34.9)    PLAN:    In order of problems listed above:  Mild coronary artery disease: Secondary prevention stressed with the patient.  Importance of compliance with diet medication stressed and patient verbalized standing. He was placed to walk at least half an hour a day on a daily basis. Mixed dyslipidemia: On lipid-lowering medications followed by primary care.  Goal LDL must be less than 60. Essential hypertension: Blood pressure is elevated.  His blood pressures at home are fine they are in the range of 130/70 and he checks it on a regular basis.  Lifestyle modification salt intake issues were discussed. Obesity: Weight reduction stressed diet emphasized.  He promises to do better.  I reviewed emergency room records extensively and questions were answered to his satisfaction. He will be seen in follow-up appointment on a as needed basis.   Medication Adjustments/Labs and Tests Ordered: Current medicines are reviewed at length with the patient today.  Concerns regarding medicines are outlined above.  No orders of the defined types were placed in this encounter.  No orders of the defined types were placed in this encounter.    No chief complaint on file.    History of Present Illness:    Adam [name] is a 68 y.o. male.  Patient has past medical history of essential hypertension, mixed dyslipidemia, mild coronary artery disease and obesity.  He denies any problems at this time and takes care of activities of daily living.  No chest pain orthopnea or PND.  He went to the emergency room with cardiac monitoring revealing no abnormalities.  This was evaluated and he was released  home.  At the time of my evaluation, the patient is alert awake oriented and in no distress.  I reviewed the monitor readings from his iPad.  His wife accompanies him for this visit.  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   DDD (degenerative disc disease), cervical    Erectile dysfunction 06/25/2018   Gynecomastia, male 11/23/2017   Hemoptysis    Hyperprolactinemia 07/20/2015   Hypertension    Hypogonadism in male 07/20/2015   Mixed dyslipidemia 03/08/2020   Obesity (BMI 30.0-34.9) 03/08/2020   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HCC)    Progressive angina (HCC) 05/15/2020   S/P cardiac cath with coronary bridging but very minimal CAD -not cause of chest pain 05/15/2020    Past Surgical History:  Procedure Laterality Date   CATARACT EXTRACTION     LEFT HEART CATH AND CORONARY ANGIOGRAPHY N/A 05/15/2020   Procedure: LEFT HEART CATH AND CORONARY ANGIOGRAPHY;  Surgeon: Anner Alm ORN, MD;  Location: [facility] [place] INVASIVE CV LAB;  Service: Cardiovascular;  Laterality: N/A;   RIGHT HEART CATH  2006   ROTATOR CUFF REPAIR Right 04/2016    Current Medications: Current Meds  Medication Sig  aspirin  EC 81 MG tablet Take 81 mg by mouth daily.   cabergoline  (DOSTINEX ) 0.5 MG tablet Take 0.5 tablets by mouth 2 (two) times a week. 1/2 pill one time a week   carvedilol (COREG) 3.125 MG tablet Take 3.125 mg by mouth 2 (two) times daily with a meal.   fluticasone  (FLONASE ) 50 MCG/ACT nasal spray Place 2 sprays into both nostrils 2 (two) times daily.   gabapentin  (NEURONTIN ) 300 MG capsule Take 300 mg by mouth 2 (two) times daily.   levofloxacin (LEVAQUIN) 500 MG tablet Take 500 mg by mouth  daily.   levothyroxine  (SYNTHROID ) 88 MCG tablet Take 88 mcg by mouth daily.   meloxicam (MOBIC) 15 MG tablet Take 15 mg by mouth daily.   nitroGLYCERIN  (NITROSTAT ) 0.4 MG SL tablet Place 0.4 mg under the tongue every 5 (five) minutes as needed for chest pain.   NON FORMULARY Take 320 mg by mouth daily. Saw palmetto   pantoprazole  (PROTONIX ) 40 MG tablet Take 40 mg by mouth daily.   QUERCETIN  PO Take 500 mg by mouth daily.   rosuvastatin  (CRESTOR ) 10 MG tablet Take 10 mg by mouth at bedtime.   spironolactone  (ALDACTONE ) 25 MG tablet Take 25 mg by mouth daily.   SYMBICORT 160-4.5 MCG/ACT inhaler Inhale 1-2 puffs into the lungs as needed for shortness of breath or wheezing.   telmisartan (MICARDIS) 80 MG tablet Take 80 mg by mouth daily.   testosterone  cypionate (DEPOTESTOSTERONE CYPIONATE) 200 MG/ML injection Inject 0.8 mLs into the muscle as directed. 0.6ml every 12 days   zolpidem  (AMBIEN ) 10 MG tablet Take 10 mg by mouth at bedtime as needed for sleep.     Allergies:   Patient has no known allergies.   Social History   Socioeconomic History   Marital status: Married    Spouse name: Not on file   Number of children: Not on file   Years of education: Not on file   Highest education level: Not on file  Occupational History   Not on file  Tobacco Use   Smoking status: Never   Smokeless tobacco: Never  Vaping Use   Vaping status: Never Used  Substance and Sexual Activity   Alcohol use: Never   Drug use: Never   Sexual activity: Not on file  Other Topics Concern   Not on file  Social History Narrative   Are you right handed or left handed? Right    Are you currently employed ? no   What is your current occupation? retired   Do you live at home alone?no   Who lives with you? Wife and dog   What type of home do you live in: 1 story or 2 story? One    Caffeine 2 soda a day   Social Drivers of Corporate Investment Banker Strain: Not on file  Food Insecurity: Low Risk   (06/30/2023)   Received from [facility]   Hunger Vital Sign    Within the past 12 months, you worried that your food would run out before you got money to buy more: Never true    Within the past 12 months, the food you bought just didn't last and you didn't have money to get more. : Never true  Transportation Needs: No Transportation Needs (06/30/2023)   Received from [facility]    In the past 12 months, has lack of reliable transportation kept you from medical appointments, meetings, work or from getting things needed for  daily living? : No  Physical Activity: Not on file  Stress: Not on file  Social Connections: Not on file     Family History: The patient's family history includes Heart disease in his maternal grandfather; Hypertension in his brother, father, mother, and sister; Ovarian cancer in his maternal grandmother and mother; Pancreatic cancer in his father.  ROS:   Please see the history of present illness.    All other systems reviewed and are negative.  EKGs/Labs/Other Studies Reviewed:    The following studies were reviewed today: .SABRA   I discussed my findings with the patient at length   Recent Labs: 01/15/2023: ALT 19; Hemoglobin 16.1; Platelets 144 02/04/2023: BUN 19; Creatinine, Ser 1.28; Potassium 4.1; Sodium 137  Recent Lipid Panel    Component Value Date/Time   CHOL 125 01/22/2021 0944   TRIG 85 01/22/2021 0944   HDL 45 01/22/2021 0944   CHOLHDL 2.8 01/22/2021 0944   LDLCALC 63 01/22/2021 0944    Physical Exam:    VS:  BP (!) 160/80   Pulse 70   Ht 6' 1 (1.854 m)   Wt 245 lb 6.4 oz (111.3 kg)   SpO2 94%   BMI 32.38 kg/m     Wt Readings from Last 3 Encounters:  11/18/23 245 lb 6.4 oz (111.3 kg)  10/02/23 238 lb 3.2 oz (108 kg)  07/01/23 235 lb (106.6 kg)     GEN: Patient is in no acute distress HEENT: Normal NECK: No JVD; No carotid bruits LYMPHATICS: No lymphadenopathy CARDIAC: Hear sounds regular, 2/6 systolic murmur  at the apex. RESPIRATORY:  Clear to auscultation without rales, wheezing or rhonchi  ABDOMEN: Soft, non-tender, non-distended MUSCULOSKELETAL:  No edema; No deformity  SKIN: Warm and dry NEUROLOGIC:  Alert and oriented x 3 PSYCHIATRIC:  Normal affect   Signed, Jennifer JONELLE Crape, MD  11/18/2023 10:23 AM    [facility]

## 2023-11-20 DIAGNOSIS — E291 Testicular hypofunction: Secondary | ICD-10-CM | POA: Diagnosis not present

## 2023-12-02 DIAGNOSIS — E349 Endocrine disorder, unspecified: Secondary | ICD-10-CM | POA: Diagnosis not present

## 2023-12-09 DIAGNOSIS — L814 Other melanin hyperpigmentation: Secondary | ICD-10-CM | POA: Diagnosis not present

## 2023-12-09 DIAGNOSIS — L570 Actinic keratosis: Secondary | ICD-10-CM | POA: Diagnosis not present

## 2023-12-09 DIAGNOSIS — L578 Other skin changes due to chronic exposure to nonionizing radiation: Secondary | ICD-10-CM | POA: Diagnosis not present

## 2023-12-09 DIAGNOSIS — L820 Inflamed seborrheic keratosis: Secondary | ICD-10-CM | POA: Diagnosis not present

## 2023-12-14 DIAGNOSIS — E349 Endocrine disorder, unspecified: Secondary | ICD-10-CM | POA: Diagnosis not present

## 2024-01-22 ENCOUNTER — Encounter: Payer: Self-pay | Admitting: *Deleted

## 2024-01-22 NOTE — Progress Notes (Signed)
 SANCHEZ HEMMER                                          MRN: [id number]   01/22/2024   The [facility] Quality Team Specialist reviewed this patient medical record for the purposes of chart review for care gap closure. The following were reviewed: chart review for care gap closure-colorectal cancer screening.    [facility]BCI Quality Team
